# Patient Record
Sex: Male | Born: 1975 | Race: White | Hispanic: No | Marital: Married | Smoking: Never smoker
Health system: Southern US, Community
[De-identification: ages and names within clinical notes are randomized; demographics above are authoritative.]

## PROBLEM LIST (undated history)

## (undated) DIAGNOSIS — D446 Neoplasm of uncertain behavior of carotid body: Secondary | ICD-10-CM

## (undated) DIAGNOSIS — E781 Pure hyperglyceridemia: Secondary | ICD-10-CM

## (undated) DIAGNOSIS — I10 Essential (primary) hypertension: Secondary | ICD-10-CM

## (undated) DIAGNOSIS — J45909 Unspecified asthma, uncomplicated: Secondary | ICD-10-CM

## (undated) HISTORY — DX: Essential (primary) hypertension: I10

## (undated) HISTORY — DX: Unspecified asthma, uncomplicated: J45.909

## (undated) HISTORY — PX: VASECTOMY: SHX75

## (undated) HISTORY — DX: Pure hyperglyceridemia: E78.1

## (undated) HISTORY — DX: Neoplasm of uncertain behavior of carotid body: D44.6

---

## 2001-04-22 ENCOUNTER — Encounter: Payer: Self-pay | Admitting: Emergency Medicine

## 2001-04-22 ENCOUNTER — Emergency Department (HOSPITAL_COMMUNITY): Admission: EM | Admit: 2001-04-22 | Discharge: 2001-04-22 | Payer: Self-pay | Admitting: Emergency Medicine

## 2004-03-15 ENCOUNTER — Ambulatory Visit: Payer: Self-pay | Admitting: Internal Medicine

## 2004-03-22 ENCOUNTER — Ambulatory Visit: Payer: Self-pay | Admitting: Internal Medicine

## 2006-04-23 HISTORY — PX: CAROTID BODY TUMOR EXCISION: SHX5156

## 2007-01-16 ENCOUNTER — Ambulatory Visit: Payer: Self-pay | Admitting: Internal Medicine

## 2007-01-20 LAB — CONVERTED CEMR LAB
ALT: 53 units/L (ref 0–53)
AST: 29 units/L (ref 0–37)
Albumin: 4.5 g/dL (ref 3.5–5.2)
Alkaline Phosphatase: 83 units/L (ref 39–117)
BUN: 7 mg/dL (ref 6–23)
Basophils Absolute: 0 10*3/uL (ref 0.0–0.1)
Basophils Relative: 0.4 % (ref 0.0–1.0)
Bilirubin, Direct: 0.1 mg/dL (ref 0.0–0.3)
CO2: 29 meq/L (ref 19–32)
Calcium: 9.8 mg/dL (ref 8.4–10.5)
Chloride: 103 meq/L (ref 96–112)
Creatinine, Ser: 0.9 mg/dL (ref 0.4–1.5)
Eosinophils Absolute: 0.1 10*3/uL (ref 0.0–0.6)
Glucose, Bld: 77 mg/dL (ref 70–99)
Hemoglobin: 15 g/dL (ref 13.0–17.0)
MCV: 88.5 fL (ref 78.0–100.0)
Neutrophils Relative %: 53.3 % (ref 43.0–77.0)
Sodium: 139 meq/L (ref 135–145)
Total Bilirubin: 0.8 mg/dL (ref 0.3–1.2)

## 2007-01-21 ENCOUNTER — Ambulatory Visit (HOSPITAL_COMMUNITY): Admission: RE | Admit: 2007-01-21 | Discharge: 2007-01-21 | Payer: Self-pay | Admitting: Otolaryngology

## 2007-02-13 ENCOUNTER — Ambulatory Visit: Payer: Self-pay | Admitting: *Deleted

## 2007-02-13 ENCOUNTER — Encounter: Payer: Self-pay | Admitting: Internal Medicine

## 2007-02-18 ENCOUNTER — Encounter: Payer: Self-pay | Admitting: *Deleted

## 2007-03-14 ENCOUNTER — Encounter (INDEPENDENT_AMBULATORY_CARE_PROVIDER_SITE_OTHER): Payer: Self-pay | Admitting: *Deleted

## 2007-03-14 ENCOUNTER — Ambulatory Visit: Payer: Self-pay | Admitting: *Deleted

## 2007-03-15 ENCOUNTER — Inpatient Hospital Stay (HOSPITAL_COMMUNITY): Admission: RE | Admit: 2007-03-15 | Discharge: 2007-03-17 | Payer: Self-pay | Admitting: Interventional Radiology

## 2007-03-17 ENCOUNTER — Ambulatory Visit: Payer: Self-pay | Admitting: *Deleted

## 2007-04-09 ENCOUNTER — Encounter: Payer: Self-pay | Admitting: Internal Medicine

## 2007-04-10 ENCOUNTER — Ambulatory Visit: Payer: Self-pay | Admitting: *Deleted

## 2008-01-01 ENCOUNTER — Ambulatory Visit (HOSPITAL_COMMUNITY): Admission: RE | Admit: 2008-01-01 | Discharge: 2008-01-01 | Payer: Self-pay | Admitting: Otolaryngology

## 2008-07-11 IMAGING — CT CT NECK W/ CM
3 of 4 series · 16 of 33 positions shown, 19 images · IV contrast (APPLIED)
Comparison: None.

CLINICAL DATA: 30-year-old, right neck mass.  
NECK CT WITH CONTRAST:
TECHNIQUE: Multidetector CT imaging of the neck was performed following the standard protocol during administration of intravenous contrast.
Contrast:  100 cc Omnipaque 300

[Series 2: st neck 2.0 b31s · axial · 0.46mm/px · z∈[-317,-109]mm · 8 of 134 slices shown, 10 images]
[im 15/134  soft-tissue]
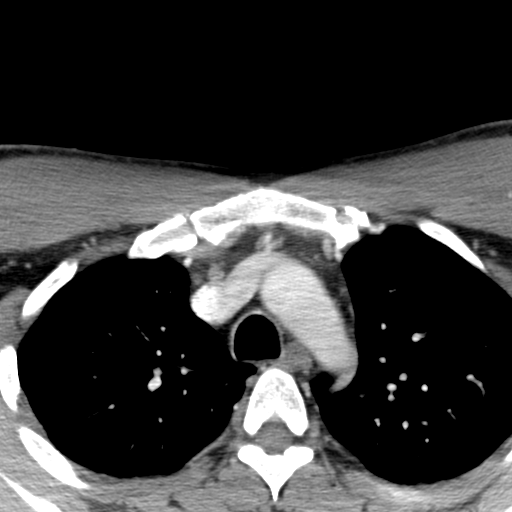
[im 15/134  bone]
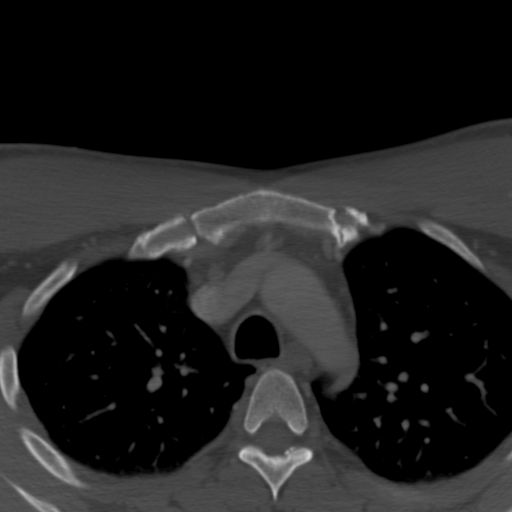
[im 30/134  bone]
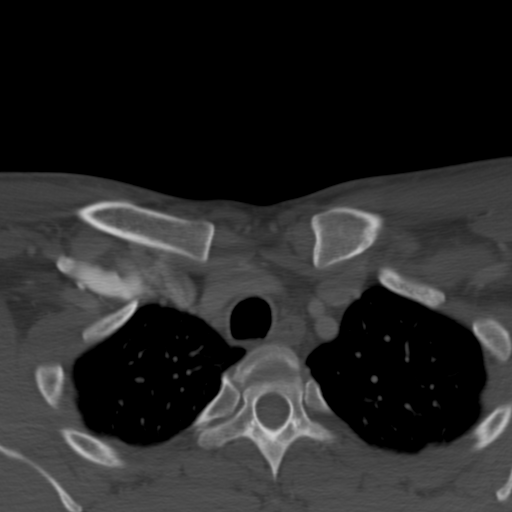
[im 45/134  bone]
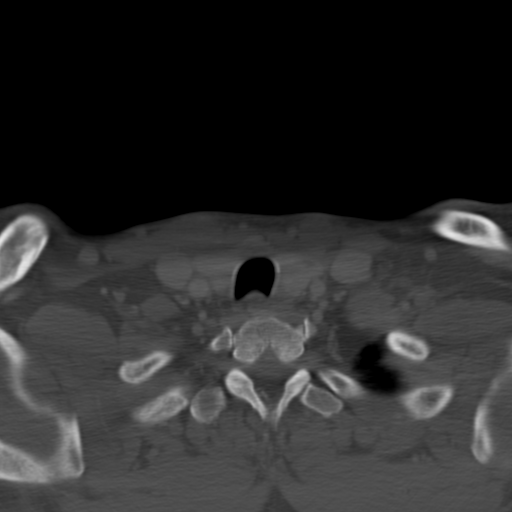
[im 60/134  bone]
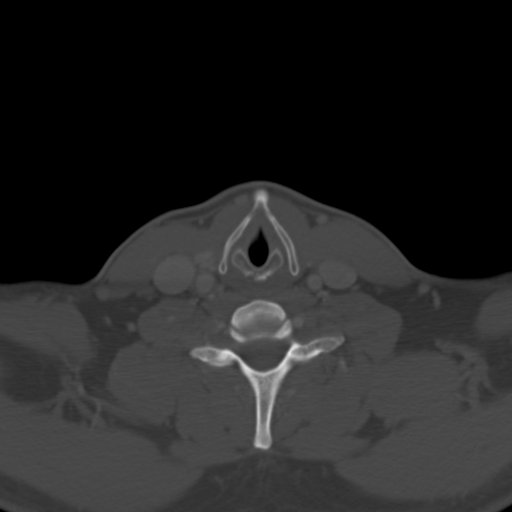
[im 74/134  soft-tissue]
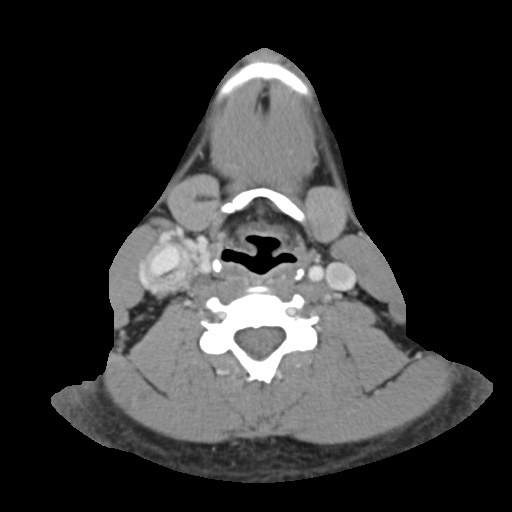
[im 74/134  bone]
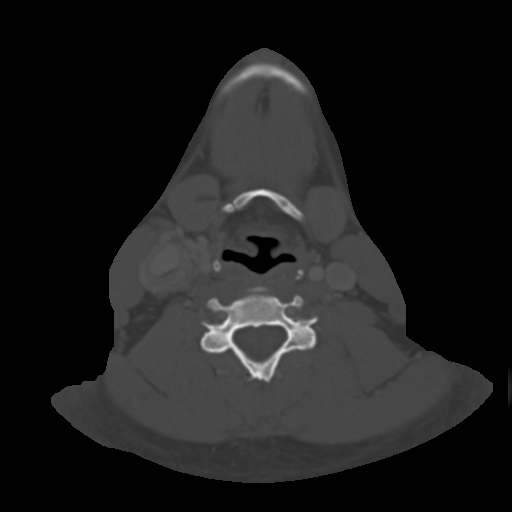
[im 89/134  bone]
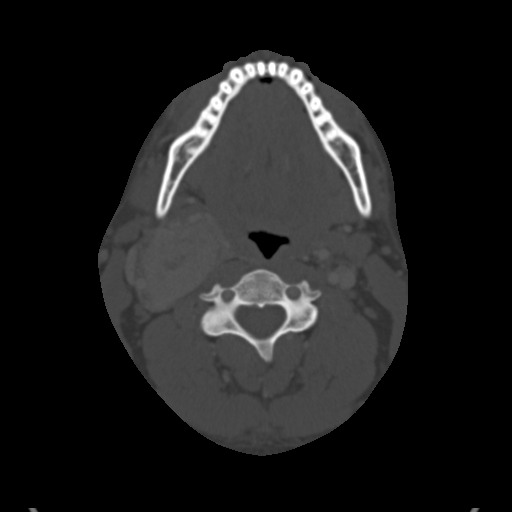
[im 104/134  bone]
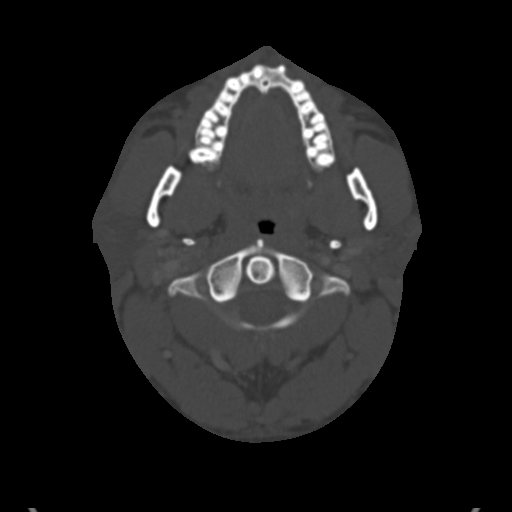
[im 119/134  bone]
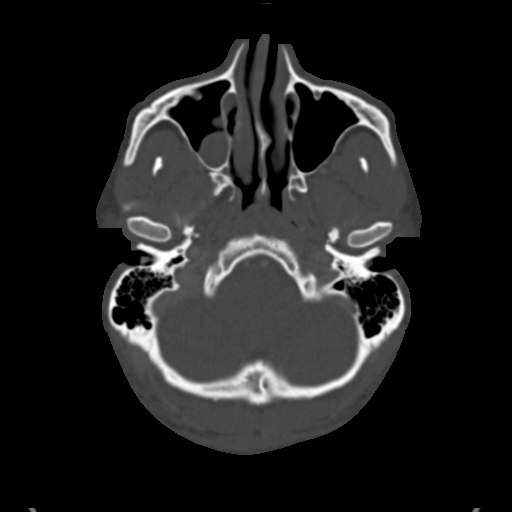

[Series 5: st neck 2.0 spo · coronal · 0.55mm/px · 3 of 105 slices shown]
[im 21/105  bone]
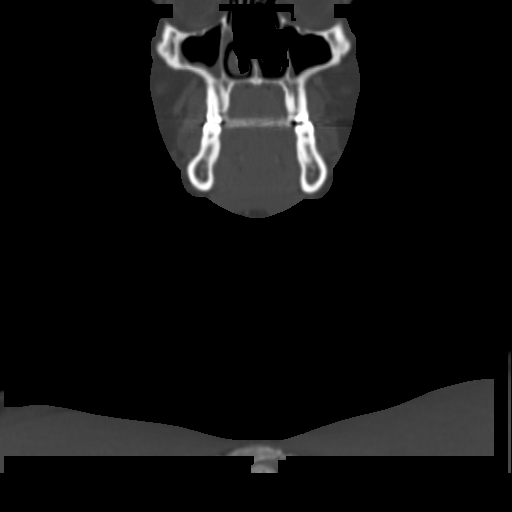
[im 42/105  bone]
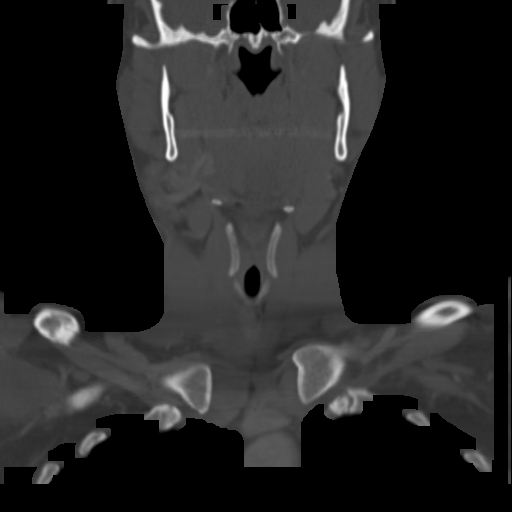
[im 63/105  bone]
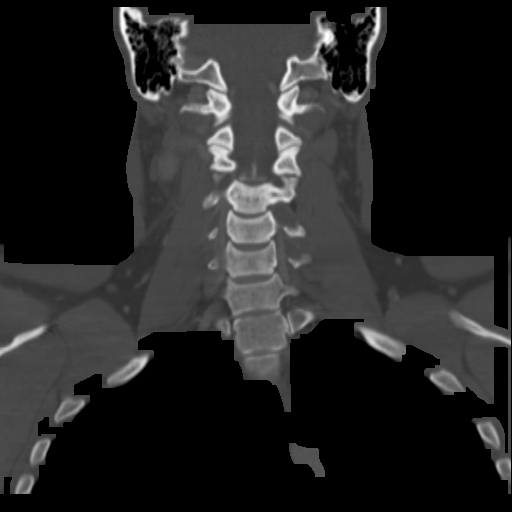

[Series 6: st neck 2.0 spo sag · sagittal · 0.54mm/px · 5 of 117 slices shown, 6 images]
[im 39/117  bone]
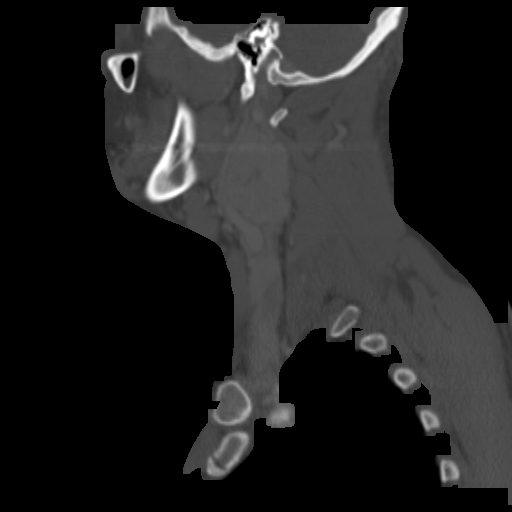
[im 49/117  bone]
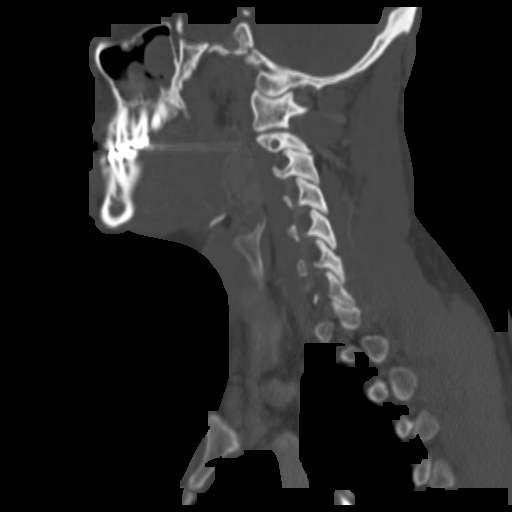
[im 59/117  soft-tissue]
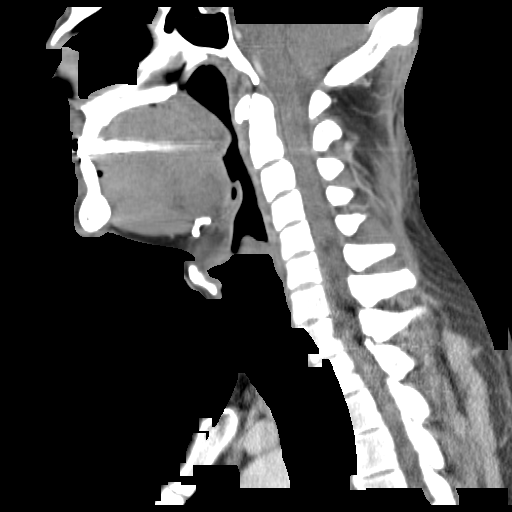
[im 59/117  bone]
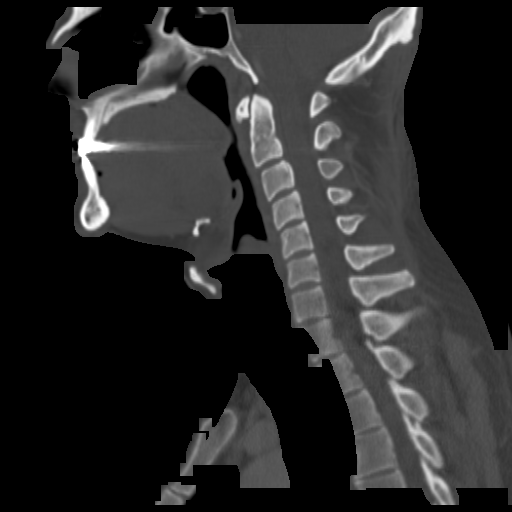
[im 68/117  bone]
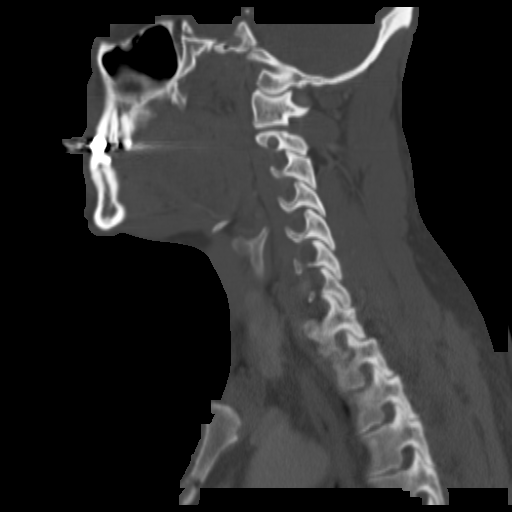
[im 78/117  bone]
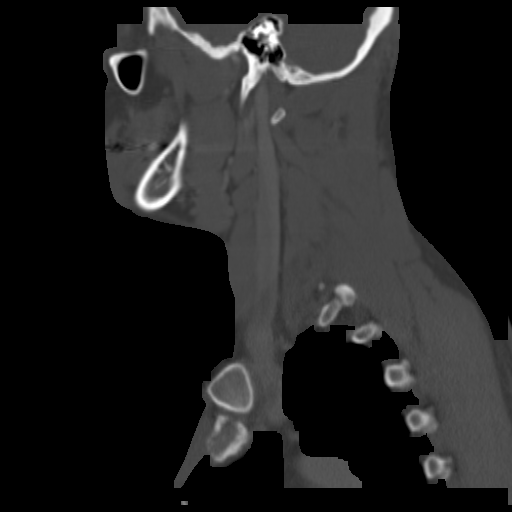

[16 of 33 positions shown; findings below may reference images not displayed]

FINDINGS: There is a large enhancing mass involving the carotid space.  The epicenter is in the bifurcation region of the common carotid artery into the internal and external carotid arteries.  This is most likely a carotid body tumor/paraganglioma given its location and enhancement features.  It measures approximately 5.6 x 4.4 x 3.4 cm.  It is also possible that this could represent a glomus tumor but I think that is less likely.  No adenopathy.  The skull base, floor of the mouth, and tongue base are unremarkable.  There is a mixed retention cyst or polyp noted in the right maxillary sinus.  The vertebral arteries appear normal.  The carotid arteries and jugular veins are intact bilaterally.  The larynx is normal.  The submandibular glands and parotid glands are unremarkable.  
The lung apices are clear.  No superior mediastinal or supraclavicular adenopathy.  Thyroid gland is normal.
IMPRESSION: 1.  Large, 5 x 4 x 3 cm enhancing mass in the carotid space with its epicenter at the bifurcation of the common carotid artery on the right side.  This is most likely a carotid body tumor/paraganglioma.  
2.  No neck adenopathy.  
3.  Normal larynx.

## 2010-09-05 NOTE — Discharge Summary (Signed)
NAMEKHOLE, BRANCH                 ACCOUNT NO.:  [id number]   MEDICAL RECORD NO.:  [id number]          PATIENT TYPE:  INP   LOCATION:  3307                         FACILITY:  [facility]   PHYSICIAN:  Newman Pies, MD            DATE OF BIRTH:  Dec 20, 1975   DATE OF ADMISSION:  03/13/2007  DATE OF DISCHARGE:  03/17/2007                               DISCHARGE SUMMARY   CO-SURGEONS:  1. Balinda Quails, M.D.  2. Newman Pies, M.D.   ADMISSION DIAGNOSIS:  Right carotid body tumor.   DISCHARGE DIAGNOSIS:  Right carotid body tumor.   CONSULTATIONS:  Interventional radiology.   ADMITTING SERVICES:  Vascular surgery, otolaryngology and head and neck  surgery.   PROCEDURE PERFORMED:  1. Embolization of the right carotid body tumor by Dr. Corliss Skains.  2. Right carotid body tumor resection.  3. Light selective neck lymphadenectomy.   HISTORY OF PRESENT ILLNESS:  [name] is a 34 year old white male  with a history of a 5 cm right carotid body tumor.  The tumor was noted  at the bifurcation of the right external and internal carotid artery.  Based on the site of the tumor, the decision was made for the patient to  undergo preop embolization of the tumor prior to surgical excision.  He  was admitted on March 13, 2007 for embolization of the tumor by Dr.  Corliss Skains.  He underwent the procedure without difficulty.  On November  21, of 2008, he was taken to the operating room for the resection of the  surgery.  Please see the operative note dictation for the details of the  surgery.   After the surgery the patient was observed in the intensive care unit  overnight.  He did well without any significant difficulty.  His initial  dysphagia gradually improved on postop day #2 and day #3.  His cranial  nerves, XI and XII, were intact.  His voice was noted to be normal and  strong.  On March 17, 2007, the patient was discharged home in good  condition.  At the time of discharge, he was tolerating  p.o. without  difficulty.  His neck incision was clean, dry and intact.  No drainage.  All skin erythema was noted.   DISCHARGE DIET:  Regular diet.   DISCHARGE ACTIVITY:  Up ad lib.   DISCHARGE MEDICATIONS:  1. Percocet one to two tablet p.o. q.4-6 h p.r.n. pain.  2. Keflex 500 mg p.o. q.i.d. for 5 days.   FOLLOW-UP CARE:  The patient will follow-up with Dr. Suszanne Conners in 1 week and  Dr. Madilyn Fireman in 2 weeks.  He is encouraged to call with any questions or  concerns.      Newman Pies, MD  Electronically Signed     ST/MEDQ  D:  03/17/2007  T:  03/17/2007  Job:  [id number]

## 2010-09-05 NOTE — Op Note (Signed)
[name], Roberto Black NO.:  [id number]   MEDICAL RECORD NO.:  [id number]          PATIENT TYPE:  OIB   LOCATION:  3106                         FACILITY:  [facility]   PHYSICIAN:  Newman Pies, MD            DATE OF BIRTH:  [birth year]   DATE OF PROCEDURE:  03/14/2007  DATE OF DISCHARGE:                               OPERATIVE REPORT   CO-SURGEONS:  Newman Pies, M.D., and Bud Face, M.D.   PREOPERATIVE DIAGNOSIS:  A 5-cm right carotid body tumor.   POSTOPERATIVE DIAGNOSIS:  A 5-cm right carotid body tumor.   PROCEDURE PERFORMED:  1. Excision of the right carotid body tumor with preservation of the      internal carotid artery and resection of the external carotid      artery.  2. Right selective level I, II and III neck dissection.   ANESTHESIA:  General endotracheal tube anesthesia.   COMPLICATIONS:  None.   ESTIMATED BLOOD LOSS:  200 mL.   INDICATIONS FOR PROCEDURE:  [name] is a 35 year old white male with  a 1-year history of right neck mass.  CT scan showed a 5-cm right  carotid body tumor.  The tumor was located at the junction of the right  internal and external carotid artery.  Based on that finding, the  decision was made for the patient to undergo resection of the right  carotid body tumor.  Due to the large size of the tumor, it was deemed  necessary to remove the right submandibular gland and level I, II and  III lymph node to completely expose the tumor.  The patient underwent  embolization of the right carotid body tumor prior to the surgery.  The  risks, benefits, alternatives, and details of the procedures were  discussed with the patient.  Questions were invited and answered.  Informed consent was obtained.   DESCRIPTION OF PROCEDURE:  The patient was taken to the operating room  and placed supine on the operating table.  General endotracheal tube  anesthesia was administered by the anesthesiologist.  Preoperative IV  cephazolin antibiotic was  given.  The patient was then positioned and  prepped and draped in a standard fashion for right carotid body tumor  resection.  One percent lidocaine with 1:100,000 epinephrine were  injected at the planned site of incisions.  A curvilinear incision was  made, extending from the right mastoid down to the right anterior neck.  Platysmal flap was elevated in the usual fashion.  The right external  jugular vein and the right great auricular nerve were sacrificed due to  the need to expose the tumor widely.  Attention was first focused on  removing the right submandibular gland and the level I lymph node.  The  fascia overlying the right submandibular gland was elevated, preserving  the right marginal mandibular branch of the facial nerve.  The right  submandibular gland and the facial level I lymph nodes were subsequently  removed, preserving the lingual nerve and the hypoglossal nerve.   The right level II and III  lymph node was then resected free from the  sternocleidomastoid muscle.  The spinal accessory nerve was identified  and preserved.  The lymph node was resected free from the internal  jugular vein.  At this point, the large glomus tumor was identified at  the junction between the external and internal carotid arteries.  The  tumor was then carefully dissected free from the internal carotid  artery.  The feeding vessels were suture ligated prior to transection.  The tumor was noted to be completely encasing the external carotid  artery.  The external carotid artery was subsequently ligated at the  level of the carotid bifurcation.  It was subsequently transected to  free the tumor from its inferior attachment.  The vagus nerve was  subsequently identified.  The tumor was carefully dissected free from  the vagus nerve.  The tumor was then carefully dissected free from the  surrounding soft tissues, with care taken to suture ligate all feeding  vessels before transection.  The ansa  cervicalis nerve and the superior  laryngeal nerves were noted to be involved in the tumor and was removed.  The entire tumor was then removed, preserving the spinal accessory  nerve, vagus nerve, and hypoglossal nerve.  The internal carotid artery  was intact and was noted to have a good flow signal at the end of the  case.  The surgical site was copiously irrigated.  A #10 JP drain was  placed.  The subplatysmal skin flap was closed in layers using 3-0  Vicryl and 5-0 Monocryl.  The care of the patient was turned over to the  anesthesiologist.  Dr. Madilyn Fireman and I were scrubbed in for the entire case.  The patient was subsequently awakened from anesthesia without  difficulty.  He was extubated and transferred to the recovery room in  good condition.   OPERATIVE FINDINGS:  A large 5-cm carotid body tumor was noted at the  junction of the carotid bifurcation on the right side.  In order to  expose the carotid tumor, a right submandibular gland, level I, II and  III lymph nodes were resected together with a carotid body tumor.  The  spinal accessory nerve, vagus nerve, and hypoglossal nerves were  identified and preserved.  The external jugular vein and the external  carotid artery were transected.   SPECIMENS REMOVED:  Right carotid body tumor, right level I, II and III  lymph nodes with the right submandibular gland.   FOLLOW-UP CARE:  The patient will be returned to the surgical unit.  He  will be placed on the neural monitoring.  The patient will likely be  discharged home in 2-3 days.      Newman Pies, MD  Electronically Signed     ST/MEDQ  D:  03/14/2007  T:  03/15/2007  Job:  [id number]

## 2010-09-05 NOTE — Op Note (Signed)
Roberto Black, CENTRELLA NO.:  [id number]   MEDICAL RECORD NO.:  [id number]          PATIENT TYPE:  OIB   LOCATION:  3106                         FACILITY:  [facility]   PHYSICIAN:  Balinda Quails, M.D.    DATE OF BIRTH:  [birth year]   DATE OF PROCEDURE:  03/14/2007  DATE OF DISCHARGE:                               OPERATIVE REPORT   SURGEON:  P Bud Face, MD,   CO-SURGEON:  Newman Pies, M.D.   ANESTHESIA:  General endotracheal anesthesia.   ANESTHESIOLOGIST:  Guadalupe Maple, M.D.   PREOPERATIVE DIAGNOSIS:  A 5 cm right carotid body tumor.   POSTOPERATIVE DIAGNOSIS:  A 5 cm right carotid body tumor.   OPERATION/PROCEDURE:  Resection of right carotid body tumor.   CLINICAL NOTE:  Sang T. Peeples is a 35 year old male who describes  feeling of a right neck mass for at least a year.  He eventually was  seen by Dr.  Suszanne Conners and had a CT scan verifying a fairly classic-appearing  right carotid body tumor.  This was 5 cm in size.  The patient was  admitted to the hospital yesterday and underwent embolization by Dr.  Corliss Skains in neuroradiology.   He is brought to the operating at this time for planned resection of  right carotid body tumor.  Potential risks the operative procedure were  reviewed with the patient in detail, major being mortality 2-3%.  Major  complications including significant cranial nerve injury were discussed  with the patient.   OPERATIVE PROCEDURE:  The patient brought to the operating room stable  condition.  Placed in supine position.  Arterial line in place.  Right  neck prepped and draped in sterile fashion.   Dr. Suszanne Conners then completed exposure through a transverse neck incision.  Details of this are dictated under separate heading.   Once full exposure was completed, the right common carotid artery was  exposed proximally and encircled with vessel loop.  Distal dissection  then carried up to the bifurcation.  At the carotid bifurcation this  large carotid body tumor was evident.  Extensive vessels were identified  feeding into and arising from the tumor.  The right internal jugular  vein was dissected off of the tumor laterally.  Facial vein ligated with  2-0 silk and divided.  The right internal carotid artery was identified  and the tumor was dissected off of the anterior surface of the anterior  medial surface of right internal carotid artery using a combination of  bipolar cautery and ligation of vessels.   The right external carotid artery was freed and exposed at its origin  and ligated with O silk proximally distally and divided.  This allowed  the tumor to be freed from the bifurcation.  A known block resection of  the tumor along with the right external carotid artery and its branches  was carried out.   The right internal carotid artery was preserved along with the right  vagus nerve.  The hypoglossal nerve and spinal sensory nerve were all  preserved.  The greater auricular nerve was divided  and sacrificed.  The  superior laryngeal nerve also was sacrificed as this coursed through the  tumor.   Details of the neck dissection carried out by Dr. Suszanne Conners along with a  right submandibular gland excision are dictated under separate heading.   The patient tolerated the procedure well.  All gross tumor was removed.  This was an encapsulated tumor.  At completion of the procedure right  internal carotid artery was widely patent with excellent Doppler signal.   The right carotid bifurcation was fixed to the surrounding muscle with  interrupted 6-0 Prolene suture to avoid any torquing and twisting.   Dr. Suszanne Conners then completed closure of the wound.   The patient tolerated procedure well.  No apparent complications.  Transferred to recovery room in stable condition.      Balinda Quails, M.D.  Electronically Signed     PGH/MEDQ  D:  03/14/2007  T:  03/15/2007  Job:  [id number]

## 2010-09-05 NOTE — Consult Note (Signed)
NAMEJOSTEN, WARMUTH                 ACCOUNT NO.:  [id number]   MEDICAL RECORD NO.:  [id number]          PATIENT TYPE:  OUT   LOCATION:  XRAY                         FACILITY:  [facility]   PHYSICIAN:  Sanjeev K. Deveshwar, M.D.DATE OF BIRTH:  Aug 21, 1975   DATE OF CONSULTATION:  02/18/2007  DATE OF DISCHARGE:                                 CONSULTATION   CHIEF COMPLAINT:  Carotid body tumor.   HISTORY OF PRESENT ILLNESS:  This is a very pleasant 35 year old male  referred to Dr. Corliss Skains through the courtesy of Dr. Liliane Bade.  The  patient has a history of a right neck mass that has been present for  approximately 1 year.  A CT scan was performed on January 21, 2007,  that revealed a 5 x 4 x 3-cm mass of the right carotid space consistent  with a carotid body tumor/paraganglioma.  There was no adenopathy noted.  The patient has been seen by Dr. Suszanne Conners, an ear, nose and throat  physician.  He and Dr. Madilyn Fireman have discussed the case and the plan is for  resection of the tumor.  The patient has been referred to Dr. Corliss Skains  for a cerebral angiogram and embolization of the vascular supply of  prior to surgery.  The patient presents today for that consult.   PAST MEDICAL HISTORY:  The patient has been very healthy.  He had  childhood asthma, which resolved on its own in his mid 80s.  He at one  point had hypertriglyceridemia; however, he states that this is  resolved.  He has a history of a clavicle fracture.   SURGICAL HISTORY:  The patient has had no major surgeries.  He is never  had general anesthesia.   ALLERGIES:  No known drug allergies.  He denies allergy to contrast dye,  shrimp, iodine, shellfish or latex.   CURRENT MEDICATIONS:  He is not on any medications.   SOCIAL HISTORY:  The patient is married.  He has two children.  He lives  in [place].  He does not use alcohol or tobacco.  He works as a  Engineer, civil (consulting).   FAMILY HISTORY:  His mother is alive and well at age 62.  His  father is  alive at age 58 and has hypertension and hyperlipidemia.  He has one  brother and one sister, who are both alive and well.   IMPRESSION AND PLAN:  As noted, the patient presents today to discuss an  angiogram and embolization of a carotid tumor.  Dr. Corliss Skains reviewed  the results of the patient's CT scan with the patient.  He pointed out  the area of tumor in the carotid artery.  The embolization procedure was  described along with the risks and benefits.  The patient is anxious to  proceed.  He would like to have everything done at one hospital  admission, which would, hopefully, include the arteriogram and the  embolization as well as the surgery.  Dr. Corliss Skains has discussed this  plan with Dr. Madilyn Fireman.  A tentative plan is to perform the arteriogram and  embolization on  a Monday with the surgery  on Wednesday.  Dr. Dr. Madilyn Fireman and Dr. Suszanne Conners will be performing the surgery  together.  Arrangements will be finalized once the scheduling has been  completed.   Greater than 30 minutes was spent on this consult.  All of the patient's  questions were answered.  Delton See, P.A.    ______________________________  Grandville Silos. Corliss Skains, M.D.    DR/MEDQ  D:  02/18/2007  T:  02/19/2007  Job:  [id number]   cc:   Karie Schwalbe, MD  Newman Pies, MD  P. Liliane Bade, M.D.

## 2010-09-05 NOTE — Assessment & Plan Note (Signed)
OFFICE VISIT   OLIVER, HEITZENRATER  DOB:  1976/03/27                                       04/10/2007  BMWUX#:[id number]   Zaine is status post resection of a right carotid body tumor carried out  by myself and Dr. Suszanne Conners on 03/14/07. This was a well orchestrated  procedure without apparent complications. He has had some mild  swallowing problems. His voice has been good. No significant pain.   Vital signs are stable. BP mildly elevated at 154/96. Right neck  incision healing well.   I am quite pleased with these results on [facility]. He informs me that Dr.  Suszanne Conners will be following up with him in approximately 6 weeks. I will  discharge him from further follow up this time under the assumption that  Dr. Suszanne Conners will continue to follow up with him on a regular basis.   Balinda Quails, M.D.  Electronically Signed   PGH/MEDQ  D:  04/10/2007  T:  04/11/2007  Job:  [id number]   cc:   Karie Schwalbe, MD  Newman Pies, MD

## 2010-09-05 NOTE — Consult Note (Signed)
VASCULAR SURGERY CONSULTATION   Roberto Black, Roberto Black  DOB:  [birth year]                                       02/13/2007  VWUJW#:[id number]   PRIMARY CARE PHYSICIAN:  Karie Schwalbe, M.D.   REASON FOR REFERRAL:  Right neck mass.   HISTORY:  Patient is a 35 year old generally well white male referred  for evaluation of right neck mass.  Patient describes approximately one  year of a feeling of fullness in his right neck.  In July of this year,  he brought this to the attention of his family physician, who referred  him to Dr. Illene Silver for further evaluation.  Dr. Suszanne Conners did feel there was  a fullness in the right neck and a CT scan was performed.  This reveals  a 5 x 4 x 3 cm enhancing right carotid space mass consistent with a  carotid body tumor.  There is no associated neck adenopathy.  Patient  denies any pain associated with this.  There is no history of tobacco  use, no sore throat, or painful swallowing.  No recent fever, chills,  shortness of breath, cough, or sputum production.  No recent history of  nasal congestion or mononucleosis.   PAST MEDICAL HISTORY:  Childhood asthma.   SOCIAL HISTORY:  Patient is married with two children.  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  He does not smoke  or use tobacco products.  Does not drink alcohol on a regular basis.   MEDICATIONS:  None.   ALLERGIES:  None known.   FAMILY HISTORY:  Mother living at age 78, generally well.  Father living  at age 77 with hypertension and hyperlipidemia.  One brother, age 60,  one sister, age 40, generally well.  No family history of carotid body  tumors or endocrine tumors.   REVIEW OF SYSTEMS:  Refer to patient encounter form.  Patient denies  weight loss, anorexia, or fever.  No chest pain or shortness of breath.  Denies cough or sputum production.  No change in bowel habits.  No  urinary tract symptoms.  Denies joint discomfort.  No syncope,  presyncope, or dizziness.  Denies sensory, motor, or visual deficit.  No  depression.  No visual or hearing changes.  Denies bleeding problems.   PHYSICAL EXAMINATION:  Generally well-appearing 35 year old male.  Alert  and oriented.  No acute distress.  HEENT:  Pupils are equal and  reactive.  Extraocular movements are intact.  Mouth and throat are  clear.  Neck:  No palpable adenopathy.  There is a fullness in the right  neck, which is nontender.  Poorly defined mass.  No thyroid enlargement.  Chest:  Clear.  Air entry equal bilaterally.  No rales or rhonchi.  Cardiovascular:  No carotid bruits.  Heart sounds are normal without  murmurs.  Abdomen:  Soft and nontender.  No masses or organomegaly.  Neurologic:  Cranial nerves are intact.  Strength equal bilaterally.  Normal gait.  Intact sensation.   IMPRESSION:  A 5 cm right neck mass consistent with carotid body tumor.   RECOMMENDATIONS:  Arrange preoperative arteriography and embolization  per Dr. Kerby Nora.   PLAN:  Concomitant resection with cosurgeon Dr. Suszanne Conners and myself.   Balinda Quails, M.D.  Electronically Signed  PGH/MEDQ  D:  02/13/2007  Black:  02/15/2007  Job:  [id number]   cc:   Karie Schwalbe, MD  Newman Pies, MD

## 2011-01-30 LAB — BASIC METABOLIC PANEL
BUN: 15
BUN: 7
CO2: 27
Calcium: 9.6
Chloride: 103
GFR calc Af Amer: >60
GFR calc Af Amer: >60
GFR calc non Af Amer: >60
Glucose, Bld: 93
Glucose, Bld: 95
Potassium: 4

## 2011-01-30 LAB — DIFFERENTIAL
Basophils Absolute: 0
Basophils Relative: 0
Eosinophils Relative: 2
Lymphs Abs: 2.2
Monocytes Absolute: 0.8
Monocytes Relative: 10
Neutro Abs: 4.5

## 2011-01-30 LAB — CROSSMATCH

## 2011-01-30 LAB — CBC
HCT: 40.2
Hemoglobin: 13.7
Hemoglobin: 16.3
MCV: 88.5
WBC: 7.7
WBC: 9.4

## 2011-01-30 LAB — PROTIME-INR
INR: 1.1
INR: 1.1
Prothrombin Time: 14.1

## 2011-01-30 LAB — APTT
aPTT: 32
aPTT: 41 — ABNORMAL HIGH
aPTT: 42 — ABNORMAL HIGH

## 2011-01-30 LAB — ABO/RH: ABO/RH(D): O NEG

## 2011-07-30 ENCOUNTER — Ambulatory Visit (INDEPENDENT_AMBULATORY_CARE_PROVIDER_SITE_OTHER): Payer: 59 | Admitting: Family Medicine

## 2011-07-30 ENCOUNTER — Encounter: Payer: Self-pay | Admitting: Family Medicine

## 2011-07-30 VITALS — BP 138/84 | HR 92 | Temp 97.5°F | Ht 72.0 in | Wt 221.0 lb

## 2011-07-30 DIAGNOSIS — E781 Pure hyperglyceridemia: Secondary | ICD-10-CM | POA: Insufficient documentation

## 2011-07-30 DIAGNOSIS — D447 Neoplasm of uncertain behavior of aortic body and other paraganglia: Secondary | ICD-10-CM

## 2011-07-30 DIAGNOSIS — D446 Neoplasm of uncertain behavior of carotid body: Secondary | ICD-10-CM | POA: Insufficient documentation

## 2011-07-30 DIAGNOSIS — I10 Essential (primary) hypertension: Secondary | ICD-10-CM | POA: Insufficient documentation

## 2011-07-30 LAB — COMPREHENSIVE METABOLIC PANEL
ALT: 58 U/L — ABNORMAL HIGH (ref 0–53)
AST: 28 U/L (ref 0–37)
Alkaline Phosphatase: 66 U/L (ref 39–117)
BUN: 13 mg/dL (ref 6–23)
Calcium: 9.5 mg/dL (ref 8.4–10.5)
Glucose, Bld: 85 mg/dL (ref 70–99)
Potassium: 4 mEq/L (ref 3.5–5.1)
Total Bilirubin: 0.5 mg/dL (ref 0.3–1.2)

## 2011-07-30 LAB — LIPID PANEL
HDL: 30.8 mg/dL — ABNORMAL LOW (ref >39.00)
VLDL: 56.6 mg/dL — ABNORMAL HIGH (ref 0.0–40.0)

## 2011-07-30 MED ORDER — LOSARTAN POTASSIUM 50 MG PO TABS: 50.0000 mg | ORAL_TABLET | Freq: Every day | ORAL | Status: DC

## 2011-07-30 NOTE — Progress Notes (Signed)
Hypertension:    Using medication without problems or lightheadedness: yes Chest pain with exertion: no Edema:no Short of breath:no Average home BPs: 120-130's/80s Tolerating meds well.    Elevated Cholesterol: Using medications without problems:yes Muscle aches: no Diet compliance:yes Exercise:some We talked about diet and exercise.  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    Meds, vitals, and allergies reviewed.   PMH and SH reviewed  ROS: See HPI.  Otherwise negative.    GEN: nad, alert and oriented HEENT: mucous membranes moist, postsurgical changes noted on R side but no mass noted.  NECK: supple w/o LA CV: rrr. PULM: ctab, no inc wob ABD: soft, +bs EXT: no edema SKIN: no acute rash

## 2011-07-30 NOTE — Assessment & Plan Note (Signed)
Controlled, continue current meds.  See notes on lab.  Will work on diet/weight/exercise.

## 2011-07-30 NOTE — Patient Instructions (Signed)
Don't change your meds.  Keep working on diet and exercise.  Look at heart.org for diet/exercise tips.   We'll contact you with your lab report. Glad to see you.

## 2011-07-30 NOTE — Assessment & Plan Note (Signed)
Will continue fish oil, will work on diet and exercise. >30 min spent with face to face with patient, >50% counseling and/or coordinating care.  Old records noted.

## 2011-07-30 NOTE — Assessment & Plan Note (Signed)
Prev surgery notes reviewed.

## 2011-08-02 ENCOUNTER — Encounter: Payer: Self-pay | Admitting: *Deleted

## 2011-11-16 ENCOUNTER — Encounter: Payer: Self-pay | Admitting: Family Medicine

## 2011-11-16 ENCOUNTER — Ambulatory Visit (INDEPENDENT_AMBULATORY_CARE_PROVIDER_SITE_OTHER): Payer: 59 | Admitting: Family Medicine

## 2011-11-16 VITALS — BP 110/84 | HR 83 | Temp 97.9°F | Wt 218.0 lb

## 2011-11-16 DIAGNOSIS — R209 Unspecified disturbances of skin sensation: Secondary | ICD-10-CM

## 2011-11-16 DIAGNOSIS — R202 Paresthesia of skin: Secondary | ICD-10-CM | POA: Insufficient documentation

## 2011-11-16 NOTE — Progress Notes (Signed)
Neck pain.  1 week ago was waterskiing and tubing with family.  Was fine that day and the next.  This Monday, woke with muscle tightness in neck, arm and tingling in L 1st-3rd finger.  Better with motrin.  Getting better.  Still with the sensation of numbness in the hand at night.  ROM in neck is better now.  No motrin since yesterday at 3PM.  No sx on R side, none in legs.  No weakness.    Meds, vitals, and allergies reviewed.   ROS: See HPI.  Otherwise, noncontributory.  nad ncat Neck supple and normal ROM, L trap mildly ttp Normal motor and sensation in the BUE No cuff findings/impingement on L side.  Distally nv intact in the LUE w/o weakness rrr ctab

## 2011-11-16 NOTE — Assessment & Plan Note (Signed)
With resolving muscle tightness.  This could have been from water skiing or just from malposition at night while asleep.  Resolving and unremarkable exam, continue with ibuprofen- GI and renal caution- and local heat for trap.  Should resolve.  F/u prn.

## 2011-11-16 NOTE — Patient Instructions (Addendum)
Take ibuprofen as needed with food.  Drink plenty of fluids and use the heating pad on your trapezius.  Take care.  Should resolve.

## 2011-11-30 ENCOUNTER — Encounter: Payer: Self-pay | Admitting: Family Medicine

## 2011-11-30 ENCOUNTER — Ambulatory Visit (INDEPENDENT_AMBULATORY_CARE_PROVIDER_SITE_OTHER): Payer: 59 | Admitting: Family Medicine

## 2011-11-30 VITALS — BP 118/80 | HR 100 | Temp 98.6°F | Wt 215.0 lb

## 2011-11-30 DIAGNOSIS — Z569 Unspecified problems related to employment: Secondary | ICD-10-CM

## 2011-11-30 DIAGNOSIS — Z566 Other physical and mental strain related to work: Secondary | ICD-10-CM

## 2011-11-30 MED ORDER — CITALOPRAM HYDROBROMIDE 20 MG PO TABS: 20.0000 mg | ORAL_TABLET | Freq: Every day | ORAL | Status: DC

## 2011-11-30 NOTE — Progress Notes (Signed)
"  Overwhelmed".  Work is stressful at baseline and changes are taking place with work hours/shifts.  His salary will be altered, lowered.  Staff cuts are going on.  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  He's been more irritable at home.  He had a complaint (he believes filed unjustly, he has witnesses to support his position) filed against him at work.  He's concerned about patient care in the midst of the changes.  He's considering asking to work in a different department.  No Si/Hi.  He does have insomnia, guilt and tearfulness.   No illicits, excessive etoh use  Meds, vitals, and allergies reviewed.   ROS: See HPI.  Otherwise, noncontributory.  nad Affect wnl, speech and judgement wnl.  No tremor

## 2011-11-30 NOTE — Patient Instructions (Addendum)
Try the celexa 20mg  a day and call me with an update by the end of the month, sooner if needed.  Take care.

## 2011-12-02 DIAGNOSIS — Z566 Other physical and mental strain related to work: Secondary | ICD-10-CM | POA: Insufficient documentation

## 2011-12-02 NOTE — Assessment & Plan Note (Signed)
We talked about possible options: EAP  Talking to bosses, esp re: the complaint SSRI/med use Job change  Short term disability is sx are excessive.  I didn't advise him to change jobs.  He doesn't have excessive sx now; he can continue to work. We talked about nonsedating meds, ie SSRI use, in meantime.  celexa would be reasonable to try.  No SI/HI. Okay for outpatient f/u.  Routine caution and timeline of SSRI use discussed.  >25 min spent with face to face with patient, >50% counseling and/or coordinating care.  He'll call back with update.

## 2012-05-27 ENCOUNTER — Other Ambulatory Visit: Payer: Self-pay | Admitting: Family Medicine

## 2012-08-21 ENCOUNTER — Other Ambulatory Visit: Payer: Self-pay | Admitting: Family Medicine

## 2012-08-21 ENCOUNTER — Ambulatory Visit (INDEPENDENT_AMBULATORY_CARE_PROVIDER_SITE_OTHER): Payer: 59 | Admitting: Family Medicine

## 2012-08-21 ENCOUNTER — Encounter: Payer: Self-pay | Admitting: Family Medicine

## 2012-08-21 VITALS — BP 110/80 | HR 83 | Temp 98.0°F

## 2012-08-21 DIAGNOSIS — Z569 Unspecified problems related to employment: Secondary | ICD-10-CM

## 2012-08-21 DIAGNOSIS — Z566 Other physical and mental strain related to work: Secondary | ICD-10-CM

## 2012-08-21 DIAGNOSIS — I10 Essential (primary) hypertension: Secondary | ICD-10-CM

## 2012-08-21 DIAGNOSIS — Z0000 Encounter for general adult medical examination without abnormal findings: Secondary | ICD-10-CM

## 2012-08-21 DIAGNOSIS — L219 Seborrheic dermatitis, unspecified: Secondary | ICD-10-CM

## 2012-08-21 DIAGNOSIS — E781 Pure hyperglyceridemia: Secondary | ICD-10-CM

## 2012-08-21 LAB — LIPID PANEL: HDL: 23.6 mg/dL — ABNORMAL LOW (ref >39.00)

## 2012-08-21 LAB — COMPREHENSIVE METABOLIC PANEL
AST: 22 U/L (ref 0–37)
BUN: 13 mg/dL (ref 6–23)
Chloride: 101 mEq/L (ref 96–112)
Creatinine, Ser: 0.9 mg/dL (ref 0.4–1.5)
Glucose, Bld: 88 mg/dL (ref 70–99)
Potassium: 4.5 mEq/L (ref 3.5–5.1)
Sodium: 134 mEq/L — ABNORMAL LOW (ref 135–145)
Total Protein: 7.8 g/dL (ref 6.0–8.3)

## 2012-08-21 LAB — LDL CHOLESTEROL, DIRECT: Direct LDL: 80.3 mg/dL

## 2012-08-21 MED ORDER — CITALOPRAM HYDROBROMIDE 20 MG PO TABS: 20.0000 mg | ORAL_TABLET | Freq: Every day | ORAL | Status: DC

## 2012-08-21 MED ORDER — LOSARTAN POTASSIUM 50 MG PO TABS: 50.0000 mg | ORAL_TABLET | Freq: Every day | ORAL | Status: DC

## 2012-08-21 MED ORDER — DESONIDE 0.05 % EX LOTN: TOPICAL_LOTION | Freq: Two times a day (BID) | CUTANEOUS | Status: DC

## 2012-08-21 NOTE — Assessment & Plan Note (Signed)
Routine anticipatory guidance given to patient.  See health maintenance. Tetanus 2012 Flu shot yearly Colon cancer and prostate cancer screening not indicated.   Living will d/w pt.  Wife would be designated if incapacitated.   Diet and exercise d/w pt. Walking and work on diet, low carb.   He's working on both.

## 2012-08-21 NOTE — Assessment & Plan Note (Signed)
Much improved, continue current meds for now.  Consider taper later in the year.  Doing well.

## 2012-08-21 NOTE — Progress Notes (Signed)
CPE- See plan.  Routine anticipatory guidance given to patient.  See health maintenance. Tetanus 2012 Flu shot yearly Colon cancer and prostate cancer screening not indicated.   Living will d/w pt.  Wife would be designated if incapacitated.   Diet and exercise d/w pt. Walking and work on diet, low carb.   He's working on both.   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  Controlled with desonide.  Uses rarely.  Needs refill. No ADE.  Topical steroid caution discussed.    Job stress is much better with job change to [employer], part time with [employer] and possibly with SSRI. He wanted to continue med, no ADE.   He notes an improvement and wife does too. He has more patience and more tolerance.  Less irritable, less negative.   PMH and SH reviewed  Meds, vitals, and allergies reviewed.   ROS: See HPI.  Otherwise negative.    GEN: nad, alert and oriented HEENT: mucous membranes moist NECK: supple w/o LA CV: rrr. PULM: ctab, no inc wob ABD: soft, +bs EXT: no edema SKIN: no acute rash except for mild seborrhea noted on the upper eyelids and the pinnas near the [facility]

## 2012-08-21 NOTE — Patient Instructions (Addendum)
Go to the lab on the way out.  We'll contact you with your lab report.  Take care.  Keep exercising.  Notify us with concerns. Glad to see you.

## 2012-08-21 NOTE — Assessment & Plan Note (Signed)
Controlled, continue meds and work on diet/exercise/weight.

## 2012-08-21 NOTE — Assessment & Plan Note (Signed)
See notes on labs and work on diet/exercise/weight.

## 2012-08-21 NOTE — Assessment & Plan Note (Signed)
Continue prn topical tx with routine steroid caution.  He agrees.

## 2012-08-22 ENCOUNTER — Other Ambulatory Visit: Payer: Self-pay | Admitting: Family Medicine

## 2012-08-22 DIAGNOSIS — E781 Pure hyperglyceridemia: Secondary | ICD-10-CM

## 2012-08-22 MED ORDER — OMEGA-3 FATTY ACIDS 1000 MG PO CAPS: 2.0000 g | ORAL_CAPSULE | Freq: Every day | ORAL | Status: DC

## 2012-08-25 ENCOUNTER — Encounter: Payer: Self-pay | Admitting: *Deleted

## 2012-10-15 ENCOUNTER — Other Ambulatory Visit: Payer: 59

## 2012-10-22 ENCOUNTER — Other Ambulatory Visit (INDEPENDENT_AMBULATORY_CARE_PROVIDER_SITE_OTHER): Payer: BC Managed Care – PPO

## 2012-10-22 ENCOUNTER — Ambulatory Visit (INDEPENDENT_AMBULATORY_CARE_PROVIDER_SITE_OTHER): Payer: BC Managed Care – PPO | Admitting: Family Medicine

## 2012-10-22 ENCOUNTER — Encounter: Payer: Self-pay | Admitting: Family Medicine

## 2012-10-22 VITALS — BP 118/78 | HR 76 | Temp 98.8°F | Wt 219.5 lb

## 2012-10-22 DIAGNOSIS — E781 Pure hyperglyceridemia: Secondary | ICD-10-CM

## 2012-10-22 DIAGNOSIS — K625 Hemorrhage of anus and rectum: Secondary | ICD-10-CM

## 2012-10-22 LAB — LIPID PANEL
Cholesterol: 173 mg/dL (ref 0–200)
Triglycerides: 387 mg/dL — ABNORMAL HIGH (ref 0.0–149.0)

## 2012-10-22 NOTE — Patient Instructions (Addendum)
Use the diltiazem cream as needed.   Update me next week.  We can address the TG medicine then or set up GI if needed.  Take care.

## 2012-10-22 NOTE — Progress Notes (Signed)
TG up, discussed.  Doesn't tolerate fish oil. See plan.    Switched to low carb diet recently, then had hard stools, blood in stool followed that.  He used some miralax to get stools soft.  Still with intermittent blood in stool, even with softer stools.  BRBPR.  No black stools.  No FCNAVD.  No abd pain.  Some pain with BM, sharp pain, at the rectum.  No unintended weight loss.  No FH colon cancer. No h/o crohn's, colitis.   Work is going well.  He's happy about that.    Meds, vitals, and allergies reviewed.   ROS: See HPI.  Otherwise, noncontributory.  nad abd soft, not ttp, normal BS, no masses Rectal inspection w/o hemorrhoids. He may have remnant of fissure at 6 and 12 o'clock.  No gross blood.

## 2012-10-22 NOTE — Assessment & Plan Note (Signed)
Work on [employer] for now and we'll likely start fenofibrate or similar when he updates [place].

## 2012-10-22 NOTE — Assessment & Plan Note (Signed)
No BPBPR for last week.  Likely from fissure after hard stools.  rx written for dilt cream 2%, AAA QID prn.  30g, no rf. Use prn.  He agrees.  If returns, notify us for GI eval.  We didn't start fenofibrate today as I didn't want to induce other bowel changes to cloud the picture.

## 2012-10-29 ENCOUNTER — Encounter: Payer: Self-pay | Admitting: Family Medicine

## 2013-02-26 ENCOUNTER — Other Ambulatory Visit: Payer: Self-pay

## 2013-07-10 ENCOUNTER — Other Ambulatory Visit: Payer: Self-pay | Admitting: Family Medicine

## 2013-07-10 NOTE — Telephone Encounter (Signed)
Electronic refill request. Patient has not been seen in greater than 6 months with no upcoming appt scheduled.  According to refill protocol, please advise.

## 2013-07-12 NOTE — Telephone Encounter (Signed)
Sent, please get him scheduled for a CPE.  Thanks.  

## 2013-07-13 NOTE — Telephone Encounter (Signed)
Left detailed message on voicemail.  

## 2013-09-21 ENCOUNTER — Encounter: Payer: Self-pay | Admitting: Family Medicine

## 2013-09-21 ENCOUNTER — Telehealth: Payer: Self-pay | Admitting: Family Medicine

## 2013-09-21 ENCOUNTER — Ambulatory Visit (INDEPENDENT_AMBULATORY_CARE_PROVIDER_SITE_OTHER): Payer: BC Managed Care – PPO | Admitting: Family Medicine

## 2013-09-21 VITALS — BP 118/76 | HR 72 | Temp 97.8°F | Ht 72.0 in | Wt 221.0 lb

## 2013-09-21 DIAGNOSIS — E781 Pure hyperglyceridemia: Secondary | ICD-10-CM

## 2013-09-21 DIAGNOSIS — Z0000 Encounter for general adult medical examination without abnormal findings: Secondary | ICD-10-CM

## 2013-09-21 DIAGNOSIS — K625 Hemorrhage of anus and rectum: Secondary | ICD-10-CM

## 2013-09-21 DIAGNOSIS — Z566 Other physical and mental strain related to work: Secondary | ICD-10-CM

## 2013-09-21 DIAGNOSIS — I10 Essential (primary) hypertension: Secondary | ICD-10-CM

## 2013-09-21 DIAGNOSIS — L219 Seborrheic dermatitis, unspecified: Secondary | ICD-10-CM

## 2013-09-21 LAB — LIPID PANEL
CHOLESTEROL: 142 mg/dL (ref 0–200)
HDL: 26.8 mg/dL — AB (ref >39.00)
LDL Cholesterol: 49 mg/dL (ref 0–99)
TRIGLYCERIDES: 331 mg/dL — AB (ref 0.0–149.0)
Total CHOL/HDL Ratio: 5
VLDL: 66.2 mg/dL — AB (ref 0.0–40.0)

## 2013-09-21 LAB — COMPREHENSIVE METABOLIC PANEL
ALT: 60 U/L — AB (ref 0–53)
AST: 31 U/L (ref 0–37)
Albumin: 4.4 g/dL (ref 3.5–5.2)
Alkaline Phosphatase: 61 U/L (ref 39–117)
BILIRUBIN TOTAL: 0.4 mg/dL (ref 0.2–1.2)
BUN: 11 mg/dL (ref 6–23)
CO2: 28 meq/L (ref 19–32)
Calcium: 9.2 mg/dL (ref 8.4–10.5)
Chloride: 103 mEq/L (ref 96–112)
Creatinine, Ser: 1 mg/dL (ref 0.4–1.5)
GFR: 89.24 mL/min (ref >60.00)
Glucose, Bld: 86 mg/dL (ref 70–99)
Potassium: 4.3 mEq/L (ref 3.5–5.1)
Sodium: 138 mEq/L (ref 135–145)
Total Protein: 7.4 g/dL (ref 6.0–8.3)

## 2013-09-21 MED ORDER — LOSARTAN POTASSIUM 50 MG PO TABS: 50.0000 mg | ORAL_TABLET | Freq: Every day | ORAL | Status: DC

## 2013-09-21 MED ORDER — DESONIDE 0.05 % EX LOTN: TOPICAL_LOTION | Freq: Two times a day (BID) | CUTANEOUS | Status: DC

## 2013-09-21 NOTE — Assessment & Plan Note (Signed)
Controlled with PRN tx.

## 2013-09-21 NOTE — Assessment & Plan Note (Signed)
Routine anticipatory guidance given to patient.  See health maintenance. Tetanus 2012 Flu 2014 PNA and shingles not due.  Not due for colon or prostate cancer screening. Living will d/w pt.  Wife designated if patient were incapacitated.   Diet and exercise.  He's working on both.

## 2013-09-21 NOTE — Telephone Encounter (Signed)
Relevant patient education assigned to patient using Emmi. ° °

## 2013-09-21 NOTE — Assessment & Plan Note (Signed)
Resolved, no recurrence.

## 2013-09-21 NOTE — Assessment & Plan Note (Signed)
Much improved, he'll try to taper the SSRI later in the summer.

## 2013-09-21 NOTE — Patient Instructions (Signed)
Go to the lab on the way out.  We'll contact you with your lab report. Take care.  Glad to see you.  Keep exercising.

## 2013-09-21 NOTE — Assessment & Plan Note (Signed)
May need a fibrate.  See notes on labs.  D/w pt.  doing well with diet and exercise.

## 2013-09-21 NOTE — Assessment & Plan Note (Signed)
Controlled, continue diet and exercise along with ARB. Doing well.

## 2013-09-21 NOTE — Progress Notes (Signed)
Pre visit review using our clinic review tool, if applicable. No additional management support is needed unless otherwise documented below in the visit note.  CPE- See plan.  Routine anticipatory guidance given to patient.  See health maintenance. Tetanus 2012 Flu 2014 PNA and shingles not due.  Not due for colon or prostate cancer screening. Living will d/w pt.  Wife designated if patient were incapacitated.   Diet and exercise.  He's working on both.    Hypertension:    Using medication without problems or lightheadedness: yes Chest pain with exertion:no Edema:no Short of breath:no  H/o HLD and due for labs.  We had talked about possible fibrate tx.  See notes on labs.    Mood is good. Cutting back to 1/2 tab of celexa.  He is weaning off med.  Has been on 1/2 for about 1 month.  He'll try to get off in about 2-3 months.  He'll notify me if he isn't going well.    PMH and SH reviewed  Meds, vitals, and allergies reviewed.   ROS: See HPI.  Otherwise negative.    GEN: nad, alert and oriented HEENT: mucous membranes moist NECK: supple w/o LA CV: rrr. PULM: ctab, no inc wob ABD: soft, +bs EXT: no edema SKIN: no acute rash

## 2013-09-22 ENCOUNTER — Other Ambulatory Visit: Payer: Self-pay | Admitting: Family Medicine

## 2013-09-22 DIAGNOSIS — E781 Pure hyperglyceridemia: Secondary | ICD-10-CM

## 2013-09-22 MED ORDER — FENOFIBRATE 145 MG PO TABS: 145.0000 mg | ORAL_TABLET | Freq: Every day | ORAL | Status: DC

## 2013-12-14 ENCOUNTER — Other Ambulatory Visit: Payer: Self-pay | Admitting: Family Medicine

## 2014-04-28 ENCOUNTER — Other Ambulatory Visit: Payer: Self-pay | Admitting: Family Medicine

## 2014-04-28 NOTE — Telephone Encounter (Signed)
Sent. Thanks.   

## 2014-04-28 NOTE — Telephone Encounter (Signed)
Received refill request electronically from pharmacy. Last refill and office visit 09/21/13. Is it okay to refill?

## 2014-09-23 ENCOUNTER — Encounter: Payer: Self-pay | Admitting: Family Medicine

## 2014-09-23 ENCOUNTER — Ambulatory Visit (INDEPENDENT_AMBULATORY_CARE_PROVIDER_SITE_OTHER): Payer: BC Managed Care – PPO | Admitting: Family Medicine

## 2014-09-23 VITALS — BP 122/76 | HR 90 | Temp 98.4°F | Ht 72.0 in | Wt 222.8 lb

## 2014-09-23 DIAGNOSIS — E781 Pure hyperglyceridemia: Secondary | ICD-10-CM

## 2014-09-23 DIAGNOSIS — I10 Essential (primary) hypertension: Secondary | ICD-10-CM

## 2014-09-23 DIAGNOSIS — Z7189 Other specified counseling: Secondary | ICD-10-CM

## 2014-09-23 DIAGNOSIS — Z0000 Encounter for general adult medical examination without abnormal findings: Secondary | ICD-10-CM | POA: Diagnosis not present

## 2014-09-23 MED ORDER — FENOFIBRATE 145 MG PO TABS: 145.0000 mg | ORAL_TABLET | Freq: Every day | ORAL | Status: DC

## 2014-09-23 MED ORDER — LOSARTAN POTASSIUM 50 MG PO TABS: 50.0000 mg | ORAL_TABLET | Freq: Every day | ORAL | Status: DC

## 2014-09-23 MED ORDER — DESONIDE 0.05 % EX LOTN: TOPICAL_LOTION | Freq: Two times a day (BID) | CUTANEOUS | Status: DC

## 2014-09-23 NOTE — Patient Instructions (Addendum)
Fasting labs when convenient.   Don't change your meds for now.  I would get a flu shot each fall.   Take care. Glad to see you.

## 2014-09-23 NOTE — Progress Notes (Signed)
Pre visit review using our clinic review tool, if applicable. No additional management support is needed unless otherwise documented below in the visit note.  CPE- See plan.  Routine anticipatory guidance given to patient.  See health maintenance. Routine anticipatory guidance given to patient. See health maintenance. Tetanus 2012 Flu at work PNA and shingles not due.  Not due for colon or prostate cancer screening. Living will d/w pt. Wife designated if patient were incapacitated.  Diet and exercise. He's working on both.  Elevated Cholesterol: Using medications without problems: yes Muscle aches: no Diet compliance: yes Exercise:yes  Hypertension:    Using medication without problems or lightheadedness: yes Chest pain with exertion:no Edema:no Short of breath:no  Using steroid prn with relief.  Rare use.  No ADE.  Good effect.   Off SSRI and doing well. Mood is good.   PMH and SH reviewed  Meds, vitals, and allergies reviewed.   ROS: See HPI.  Otherwise negative.    GEN: nad, alert and oriented HEENT: mucous membranes moist NECK: supple w/o LA CV: rrr. PULM: ctab, no inc wob ABD: soft, +bs EXT: no edema SKIN: no acute rash

## 2014-09-24 DIAGNOSIS — Z7189 Other specified counseling: Secondary | ICD-10-CM | POA: Insufficient documentation

## 2014-09-24 NOTE — Assessment & Plan Note (Signed)
Return for labs.  Continue work on diet and exercise.  He agrees. No change in meds.

## 2014-09-24 NOTE — Assessment & Plan Note (Signed)
Controlled, return for labs.  He agrees.  No change in meds.

## 2014-09-24 NOTE — Assessment & Plan Note (Signed)
Routine anticipatory guidance given to patient. See health maintenance.  Tetanus 2012  Flu at work  PNA and shingles not due.  Not due for colon or prostate cancer screening.  Living will d/w pt. Wife designated if patient were incapacitated.  Diet and exercise. He's working on both.

## 2014-09-30 ENCOUNTER — Other Ambulatory Visit (INDEPENDENT_AMBULATORY_CARE_PROVIDER_SITE_OTHER): Payer: BC Managed Care – PPO

## 2014-09-30 ENCOUNTER — Encounter: Payer: Self-pay | Admitting: *Deleted

## 2014-09-30 DIAGNOSIS — I10 Essential (primary) hypertension: Secondary | ICD-10-CM | POA: Diagnosis not present

## 2014-09-30 LAB — COMPREHENSIVE METABOLIC PANEL
ALT: 54 U/L — AB (ref 0–53)
AST: 23 U/L (ref 0–37)
Albumin: 4.5 g/dL (ref 3.5–5.2)
Alkaline Phosphatase: 51 U/L (ref 39–117)
BUN: 17 mg/dL (ref 6–23)
CO2: 27 meq/L (ref 19–32)
Calcium: 9.6 mg/dL (ref 8.4–10.5)
Chloride: 105 mEq/L (ref 96–112)
Creatinine, Ser: 1.03 mg/dL (ref 0.40–1.50)
GFR: 84.81 mL/min (ref >60.00)
Glucose, Bld: 91 mg/dL (ref 70–99)
Potassium: 4.7 mEq/L (ref 3.5–5.1)
SODIUM: 137 meq/L (ref 135–145)
TOTAL PROTEIN: 7.2 g/dL (ref 6.0–8.3)
Total Bilirubin: 0.4 mg/dL (ref 0.2–1.2)

## 2014-09-30 LAB — LIPID PANEL
CHOL/HDL RATIO: 5
CHOLESTEROL: 130 mg/dL (ref 0–200)
HDL: 26.5 mg/dL — ABNORMAL LOW (ref >39.00)
LDL CALC: 73 mg/dL (ref 0–99)
NonHDL: 103.5
Triglycerides: 151 mg/dL — ABNORMAL HIGH (ref 0.0–149.0)
VLDL: 30.2 mg/dL (ref 0.0–40.0)

## 2015-10-04 ENCOUNTER — Other Ambulatory Visit: Payer: Self-pay | Admitting: Family Medicine

## 2015-10-07 ENCOUNTER — Encounter: Payer: Self-pay | Admitting: Family Medicine

## 2015-10-07 ENCOUNTER — Ambulatory Visit (INDEPENDENT_AMBULATORY_CARE_PROVIDER_SITE_OTHER): Payer: BC Managed Care – PPO | Admitting: Family Medicine

## 2015-10-07 VITALS — BP 102/72 | HR 75 | Temp 97.6°F | Ht 72.0 in | Wt 208.8 lb

## 2015-10-07 DIAGNOSIS — D446 Neoplasm of uncertain behavior of carotid body: Secondary | ICD-10-CM | POA: Diagnosis not present

## 2015-10-07 DIAGNOSIS — I10 Essential (primary) hypertension: Secondary | ICD-10-CM | POA: Diagnosis not present

## 2015-10-07 DIAGNOSIS — Z0000 Encounter for general adult medical examination without abnormal findings: Secondary | ICD-10-CM

## 2015-10-07 DIAGNOSIS — E781 Pure hyperglyceridemia: Secondary | ICD-10-CM | POA: Diagnosis not present

## 2015-10-07 LAB — COMPREHENSIVE METABOLIC PANEL
ALBUMIN: 4.7 g/dL (ref 3.5–5.2)
ALK PHOS: 56 U/L (ref 39–117)
ALT: 30 U/L (ref 0–53)
AST: 17 U/L (ref 0–37)
BILIRUBIN TOTAL: 0.5 mg/dL (ref 0.2–1.2)
BUN: 17 mg/dL (ref 6–23)
CO2: 29 mEq/L (ref 19–32)
Calcium: 9.6 mg/dL (ref 8.4–10.5)
Chloride: 104 mEq/L (ref 96–112)
Creatinine, Ser: 0.93 mg/dL (ref 0.40–1.50)
GFR: 94.94 mL/min (ref >60.00)
Glucose, Bld: 87 mg/dL (ref 70–99)
Potassium: 4.3 mEq/L (ref 3.5–5.1)
Sodium: 138 mEq/L (ref 135–145)
TOTAL PROTEIN: 7.7 g/dL (ref 6.0–8.3)

## 2015-10-07 LAB — LIPID PANEL
CHOLESTEROL: 128 mg/dL (ref 0–200)
HDL: 32.4 mg/dL — ABNORMAL LOW (ref >39.00)
LDL Cholesterol: 72 mg/dL (ref 0–99)
NonHDL: 95.46
Total CHOL/HDL Ratio: 4
Triglycerides: 119 mg/dL (ref 0.0–149.0)
VLDL: 23.8 mg/dL (ref 0.0–40.0)

## 2015-10-07 MED ORDER — LOSARTAN POTASSIUM 50 MG PO TABS: ORAL_TABLET | ORAL | Status: DC

## 2015-10-07 MED ORDER — FENOFIBRATE 145 MG PO TABS: 145.0000 mg | ORAL_TABLET | Freq: Every day | ORAL | Status: DC

## 2015-10-07 NOTE — Assessment & Plan Note (Signed)
Tetanus 2012 Flu at work PNA and shingles not due.  Not due for colon or prostate cancer screening. Living will d/w pt. Wife designated if patient were incapacitated.  Diet and exercise. He's working on both.  Intentional weight loss noted.   [facility].  HCV and HIV testing would have been done at that point.

## 2015-10-07 NOTE — Assessment & Plan Note (Signed)
He'll check BP a few times.  If consistently low (<120/<80), the I would cut the losartan in half.  He agrees.  See notes on labs.

## 2015-10-07 NOTE — Addendum Note (Signed)
Addended by: Marchia Bond on: 10/07/2015 09:57 AM   Modules accepted: Miquel Dunn

## 2015-10-07 NOTE — Assessment & Plan Note (Signed)
No sx, he'll call about routine f/u.

## 2015-10-07 NOTE — Patient Instructions (Signed)
Go to the lab on the way out.  We'll contact you with your lab report. Check your BP a few times.  If consistently low (<120/<80), the I would cut the losartan in half.   Update me as needed.  Take care.  Glad to see you.

## 2015-10-07 NOTE — Progress Notes (Signed)
Pre visit review using our clinic review tool, if applicable. No additional management support is needed unless otherwise documented below in the visit note.  CPE- See plan.  Routine anticipatory guidance given to patient.  See health maintenance. Tetanus 2012 Flu at work PNA and shingles not due.  Not due for colon or prostate cancer screening. Living will d/w pt. Wife designated if patient were incapacitated.  Diet and exercise. He's working on both.  Intentional weight loss noted.   [facility].  HCV and HIV testing would have been done at that point.   Doing well with work.   Hypertension:    Using medication without problems or lightheadedness: yes Chest pain with exertion:no Edema:no Short of breath:no Lower BP noted today.   Elevated Cholesterol: Using medications without problems:yes Muscle aches: no Diet compliance: yes Exercise:yes Due for labs.    PMH and SH reviewed  Meds, vitals, and allergies reviewed.   ROS: Per HPI.  Unless specifically indicated otherwise in HPI, the patient denies:  General: fever. Eyes: acute vision changes ENT: sore throat Cardiovascular: chest pain Respiratory: SOB GI: vomiting GU: dysuria Musculoskeletal: acute back pain Derm: acute rash Neuro: acute motor dysfunction Psych: worsening mood Endocrine: polydipsia Heme: bleeding Allergy: hayfever  GEN: nad, alert and oriented HEENT: mucous membranes moist NECK: supple w/o LA CV: rrr. PULM: ctab, no inc wob ABD: soft, +bs EXT: no edema SKIN: no acute rash, mult benign appear nevi noted on the trunk (he hadn't noted recent changes on any of the lesions)

## 2015-10-07 NOTE — Assessment & Plan Note (Signed)
Weight loss noted, no med change.  See notes on labs.

## 2015-10-30 ENCOUNTER — Other Ambulatory Visit: Payer: Self-pay | Admitting: Family Medicine

## 2016-10-08 ENCOUNTER — Encounter: Payer: Self-pay | Admitting: Family Medicine

## 2016-10-08 ENCOUNTER — Ambulatory Visit (INDEPENDENT_AMBULATORY_CARE_PROVIDER_SITE_OTHER): Payer: BC Managed Care – PPO | Admitting: Family Medicine

## 2016-10-08 VITALS — BP 122/70 | HR 78 | Temp 98.4°F | Ht 72.0 in | Wt 215.5 lb

## 2016-10-08 DIAGNOSIS — Z0000 Encounter for general adult medical examination without abnormal findings: Secondary | ICD-10-CM | POA: Diagnosis not present

## 2016-10-08 DIAGNOSIS — I10 Essential (primary) hypertension: Secondary | ICD-10-CM | POA: Diagnosis not present

## 2016-10-08 DIAGNOSIS — D446 Neoplasm of uncertain behavior of carotid body: Secondary | ICD-10-CM

## 2016-10-08 DIAGNOSIS — E781 Pure hyperglyceridemia: Secondary | ICD-10-CM

## 2016-10-08 LAB — COMPREHENSIVE METABOLIC PANEL
ALK PHOS: 48 U/L (ref 39–117)
ALT: 46 U/L (ref 0–53)
AST: 23 U/L (ref 0–37)
Albumin: 4.8 g/dL (ref 3.5–5.2)
BUN: 14 mg/dL (ref 6–23)
CHLORIDE: 103 meq/L (ref 96–112)
CO2: 28 mEq/L (ref 19–32)
Calcium: 9.9 mg/dL (ref 8.4–10.5)
Creatinine, Ser: 0.9 mg/dL (ref 0.40–1.50)
GFR: 98.12 mL/min (ref >60.00)
Glucose, Bld: 88 mg/dL (ref 70–99)
Potassium: 4.4 mEq/L (ref 3.5–5.1)
SODIUM: 137 meq/L (ref 135–145)
TOTAL PROTEIN: 7.5 g/dL (ref 6.0–8.3)
Total Bilirubin: 0.4 mg/dL (ref 0.2–1.2)

## 2016-10-08 LAB — LIPID PANEL
CHOL/HDL RATIO: 4
Cholesterol: 142 mg/dL (ref 0–200)
HDL: 32.6 mg/dL — AB (ref >39.00)
LDL Cholesterol: 81 mg/dL (ref 0–99)
NONHDL: 109.7
Triglycerides: 144 mg/dL (ref 0.0–149.0)
VLDL: 28.8 mg/dL (ref 0.0–40.0)

## 2016-10-08 MED ORDER — LOSARTAN POTASSIUM 50 MG PO TABS: ORAL_TABLET | ORAL | 3 refills | Status: DC

## 2016-10-08 MED ORDER — DESONIDE 0.05 % EX LOTN: TOPICAL_LOTION | Freq: Two times a day (BID) | CUTANEOUS | 3 refills | Status: DC

## 2016-10-08 MED ORDER — FENOFIBRATE 145 MG PO TABS: 145.0000 mg | ORAL_TABLET | Freq: Every day | ORAL | 3 refills | Status: DC

## 2016-10-08 NOTE — Progress Notes (Signed)
CPE- See plan.  Routine anticipatory guidance given to patient.  See health maintenance.  The possibility exists that previously documented standard health maintenance information may have been brought forward from a previous encounter into this note.  If needed, that same information has been updated to reflect the current situation based on today's encounter.    Tetanus 2012 Flu at work PNA and shingles not due.  Not due for colon or prostate cancer screening. Living will d/w pt. Wife designated if patient were incapacitated. Diet and exercise. He's working on both.  [facility]  HCV and HIV testing would have been done at that point.    Hypertension:    Using medication without problems or lightheadedness: yes Chest pain with exertion:no Edema:no Short of breath:no  Elevated Cholesterol: Using medications without problems:yes Muscle aches: no Diet compliance: encouraged.  Exercise:encouraged.   Seborrhea controlled with prn use of desowen, w/o ADE.  Using as little of med as possible, d/w pt.   PMH and SH reviewed  Meds, vitals, and allergies reviewed.   ROS: Per HPI.  Unless specifically indicated otherwise in HPI, the patient denies:  General: fever. Eyes: acute vision changes ENT: sore throat Cardiovascular: chest pain Respiratory: SOB GI: vomiting GU: dysuria Musculoskeletal: acute back pain Derm: acute rash Neuro: acute motor dysfunction Psych: worsening mood Endocrine: polydipsia Heme: bleeding Allergy: hayfever  GEN: nad, alert and oriented HEENT: mucous membranes moist NECK: supple w/o LA CV: rrr. PULM: ctab, no inc wob ABD: soft, +bs EXT: no edema SKIN: no acute rash

## 2016-10-08 NOTE — Patient Instructions (Addendum)
Go to the lab on the way out.  We'll contact you with your lab report. Take care.  Glad to see you.  Update me as needed.   Keep working on diet and exercise.

## 2016-10-09 NOTE — Assessment & Plan Note (Signed)
Tetanus 2012 Flu at work PNA and shingles not due.  Not due for colon or prostate cancer screening. Living will d/w pt. Wife designated if patient were incapacitated. Diet and exercise. He's working on both.  [facility] HCV and HIV testing would have been done at that point.   

## 2016-10-09 NOTE — Assessment & Plan Note (Signed)
Now 10 years out from procedure. No mass felt in neck by patient or by me. Normal exam now. Update me as needed.  He should not need specific follow up otherwise at this point, other than routine palpation of neck with yearly exam.

## 2016-10-09 NOTE — Assessment & Plan Note (Signed)
No change in meds. See notes on labs. Continue work on diet and exercise. He agrees.

## 2016-11-05 ENCOUNTER — Other Ambulatory Visit: Payer: Self-pay | Admitting: Family Medicine

## 2016-11-12 ENCOUNTER — Other Ambulatory Visit: Payer: Self-pay | Admitting: *Deleted

## 2017-01-09 ENCOUNTER — Other Ambulatory Visit: Payer: Self-pay | Admitting: Family Medicine

## 2017-01-09 DIAGNOSIS — E781 Pure hyperglyceridemia: Secondary | ICD-10-CM

## 2017-10-06 ENCOUNTER — Other Ambulatory Visit: Payer: Self-pay | Admitting: Family Medicine

## 2017-10-06 DIAGNOSIS — I10 Essential (primary) hypertension: Secondary | ICD-10-CM

## 2017-10-11 ENCOUNTER — Other Ambulatory Visit: Payer: BC Managed Care – PPO

## 2017-10-17 ENCOUNTER — Other Ambulatory Visit (INDEPENDENT_AMBULATORY_CARE_PROVIDER_SITE_OTHER): Payer: BC Managed Care – PPO

## 2017-10-17 DIAGNOSIS — I10 Essential (primary) hypertension: Secondary | ICD-10-CM

## 2017-10-17 LAB — COMPREHENSIVE METABOLIC PANEL
ALBUMIN: 4.6 g/dL (ref 3.5–5.2)
ALK PHOS: 40 U/L (ref 39–117)
ALT: 41 U/L (ref 0–53)
AST: 21 U/L (ref 0–37)
BUN: 18 mg/dL (ref 6–23)
CO2: 28 mEq/L (ref 19–32)
Calcium: 9.5 mg/dL (ref 8.4–10.5)
Chloride: 105 mEq/L (ref 96–112)
Creatinine, Ser: 0.98 mg/dL (ref 0.40–1.50)
GFR: 88.51 mL/min (ref >60.00)
GLUCOSE: 97 mg/dL (ref 70–99)
Potassium: 4.4 mEq/L (ref 3.5–5.1)
Sodium: 139 mEq/L (ref 135–145)
Total Bilirubin: 0.4 mg/dL (ref 0.2–1.2)
Total Protein: 7.3 g/dL (ref 6.0–8.3)

## 2017-10-17 LAB — LIPID PANEL
CHOL/HDL RATIO: 5
Cholesterol: 142 mg/dL (ref 0–200)
HDL: 26.7 mg/dL — ABNORMAL LOW (ref >39.00)
LDL CALC: 75 mg/dL (ref 0–99)
NonHDL: 115.03
TRIGLYCERIDES: 198 mg/dL — AB (ref 0.0–149.0)
VLDL: 39.6 mg/dL (ref 0.0–40.0)

## 2017-10-18 ENCOUNTER — Encounter: Payer: Self-pay | Admitting: Family Medicine

## 2017-10-18 ENCOUNTER — Ambulatory Visit (INDEPENDENT_AMBULATORY_CARE_PROVIDER_SITE_OTHER): Payer: BC Managed Care – PPO | Admitting: Family Medicine

## 2017-10-18 VITALS — BP 124/84 | HR 83 | Temp 98.6°F | Ht 72.0 in | Wt 217.0 lb

## 2017-10-18 DIAGNOSIS — I10 Essential (primary) hypertension: Secondary | ICD-10-CM

## 2017-10-18 DIAGNOSIS — E781 Pure hyperglyceridemia: Secondary | ICD-10-CM

## 2017-10-18 DIAGNOSIS — Z0000 Encounter for general adult medical examination without abnormal findings: Secondary | ICD-10-CM | POA: Diagnosis not present

## 2017-10-18 DIAGNOSIS — D446 Neoplasm of uncertain behavior of carotid body: Secondary | ICD-10-CM

## 2017-10-18 DIAGNOSIS — Z7189 Other specified counseling: Secondary | ICD-10-CM

## 2017-10-18 MED ORDER — DESONIDE 0.05 % EX LOTN
TOPICAL_LOTION | Freq: Two times a day (BID) | CUTANEOUS | 3 refills | Status: DC
Start: 2017-10-18 — End: 2019-04-06

## 2017-10-18 MED ORDER — LOSARTAN POTASSIUM 50 MG PO TABS: ORAL_TABLET | ORAL | 3 refills | Status: DC

## 2017-10-18 MED ORDER — FENOFIBRATE 145 MG PO TABS: 145.0000 mg | ORAL_TABLET | Freq: Every day | ORAL | 3 refills | Status: DC

## 2017-10-18 NOTE — Progress Notes (Signed)
CPE- See plan.  Routine anticipatory guidance given to patient.  See health maintenance.  The possibility exists that previously documented standard health maintenance information may have been brought forward from a previous encounter into this note.  If needed, that same information has been updated to reflect the current situation based on today's encounter.    Tetanus 2012 Flu at work PNA and shingles not due.  Not due for colon or prostate cancer screening. Living will d/w pt. Wife designated if patient were incapacitated. Diet and exercise. He's working on both.  [facility] HCV and HIV testing would have been done at that point.    He has been helping his children learn to [place].  Hypertension:    Using medication without problems or lightheadedness: yes Chest pain with exertion:no Edema:no Short of breath:no  Elevated Cholesterol: Using medications without problems: yes Muscle aches: no  Diet compliance:yes Exercise:yes Labs d/w pt.   He snores more now but no known apnea.  D/w pt about options.  He isn't walking with headaches.  He doesn't have fatigue other than related to schedule changes.  Not falling asleep o/w during the day.  He likely wouldn't need intervention now other than continued work on diet and exercise.  See avs.    PMH and SH reviewed  Meds, vitals, and allergies reviewed.   ROS: Per HPI.  Unless specifically indicated otherwise in HPI, the patient denies:  General: fever. Eyes: acute vision changes ENT: sore throat Cardiovascular: chest pain Respiratory: SOB GI: vomiting GU: dysuria Musculoskeletal: acute back pain Derm: acute rash Neuro: acute motor dysfunction Psych: worsening mood Endocrine: polydipsia Heme: bleeding Allergy: hayfever  GEN: nad, alert and oriented HEENT: mucous membranes moist NECK: supple w/o LA CV: rrr. PULM: ctab, no inc wob ABD: soft, +bs EXT: no edema SKIN: no acute rash

## 2017-10-18 NOTE — Patient Instructions (Addendum)
If you have any apnea (ask your wife) then let me know and we'll send you to pulmonary or Dr. Benjamine Mola.  Ask Dr. Benjamine Mola in general about follow up.    Update me as needed.  Keep working on diet and exercise.  Don't change your meds for now.  Take care.  Glad to see you.

## 2017-10-20 NOTE — Assessment & Plan Note (Signed)
Hx of, he'll ask Dr. Benjamine Mola in general about follow up.

## 2017-10-20 NOTE — Assessment & Plan Note (Signed)
Living will d/w pt.  Wife designated if patient were incapacitated.   ?

## 2017-10-20 NOTE — Assessment & Plan Note (Signed)
Tetanus 2012 Flu at work PNA and shingles not due.  Not due for colon or prostate cancer screening. Living will d/w pt. Wife designated if patient were incapacitated. Diet and exercise. He's working on both.  [facility] HCV and HIV testing would have been done at that point.

## 2017-10-20 NOTE — Assessment & Plan Note (Signed)
No change in meds otherwise.  Discussed with patient about diet and exercise and labs.  Continue current medication.

## 2017-10-20 NOTE — Assessment & Plan Note (Signed)
If any apnea (he'll ask his wife) then he'll let me know and we'll send him to pulmonary or Dr. Benjamine Mola.   No change in meds otherwise.  Discussed with patient about diet and exercise and labs.

## 2018-04-23 LAB — HM HEPATITIS C SCREENING LAB: HM Hepatitis Screen: NEGATIVE

## 2018-05-13 ENCOUNTER — Telehealth: Payer: Self-pay | Admitting: Family Medicine

## 2018-05-13 DIAGNOSIS — R0683 Snoring: Secondary | ICD-10-CM

## 2018-05-13 NOTE — Telephone Encounter (Signed)
Pt called office due to speaking with Dr.Duncan previously. Pt was told to call back in if he did decide to do a sleep study. Pt is requesting Dr.Duncan to put in a order.

## 2018-05-14 NOTE — Addendum Note (Signed)
Addended by: Tonia Ghent on: 05/14/2018 03:21 PM   Modules accepted: Orders

## 2018-05-14 NOTE — Telephone Encounter (Signed)
Left message on voicemail for patient to call back. 

## 2018-05-14 NOTE — Telephone Encounter (Signed)
I put in the referral to see pulmonary re: sleep study.  Thanks.

## 2018-05-27 ENCOUNTER — Encounter: Payer: Self-pay | Admitting: Internal Medicine

## 2018-05-27 ENCOUNTER — Ambulatory Visit: Payer: BC Managed Care – PPO | Admitting: Internal Medicine

## 2018-05-27 VITALS — BP 126/80 | HR 70 | Resp 16 | Ht 72.0 in | Wt 220.0 lb

## 2018-05-27 DIAGNOSIS — G4719 Other hypersomnia: Secondary | ICD-10-CM

## 2018-05-27 NOTE — Progress Notes (Addendum)
[facility] Consultation      Assessment and Plan:  Excessive daytime sleepiness. - Symptoms and signs of obstructive sleep apnea. - We will send for sleep study, start on CPAP as appropriate. Addendum: HST denied, split night ordered.   Essential hypertension. - Sleep apnea can contribute to above condition, therefore treatment of sleep apnea is important part of its management.  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 - Patient has possible shift work sleep disorder as he works and multiple daytime and nighttime shifts. - Discussed that if his sleep study is negative or his daytime sleepiness persists after treatment of sleep apnea, we will may need to discuss further strategies to treat this.  Orders Placed This Encounter  Procedures  . Home sleep test   Return in about 3 months (around 08/25/2018).   Date: 05/27/2018  MRN# [id number] Roberto Black [birth year]    Roberto Black is a 43 y.o. old male seen in consultation for chief complaint of:    Chief Complaint  Patient presents with  . Consult  Referred by Dr. Damita Dunnings for snoring.   HPI:   Pt notes that he has been having a lot of snoring which has been getting worse over the years. He had a carotid body tumor removed in 2008, and his swallowing was effected somewhat and feels that his snoring has gotten worse since that time.  He is sleepy during the day he goes between days and nights, working as a Marine scientist at [employer]. He works for a [employer]rop[employer] so has to work for 24 hr shifts.  He has never been tested for OSA in the past.  Denies sleep paralysis, denies cataplexy, no sleep walking, denies jaw pain but does have jaw popping, no TMJ.     PMHX:   Past Medical History:  Diagnosis Date  . Hypertension   . Hypertriglyceridemia    Surgical Hx:  Past Surgical History:  Procedure Laterality Date  . CAROTID BODY TUMOR EXCISION  2008   right, benign  . VASECTOMY     Family Hx:  Family History    Problem Relation Age of Onset  . Hyperlipidemia Father   . Hyperlipidemia Brother   . Colon cancer Neg Hx   . Prostate cancer Neg Hx    Social Hx:   Social History   Tobacco Use  . Smoking status: Never Smoker  . Smokeless tobacco: Never Used  Substance Use Topics  . Alcohol use: Yes    Comment: very rare  . Drug use: No   Medication:    Current Outpatient Medications:  .  desonide (DESOWEN) 0.05 % lotion, Apply topically 2 (two) times daily. As needed.  Limit use as much as possible., Disp: 118 mL, Rfl: 3 .  fenofibrate (TRICOR) 145 MG tablet, Take 1 tablet (145 mg total) by mouth daily., Disp: 90 tablet, Rfl: 3 .  losartan (COZAAR) 50 MG tablet, TAKE 1 TABLET (50 MG TOTAL) BY MOUTH DAILY., Disp: 90 tablet, Rfl: 3   Allergies:  Patient has no known allergies.  Review of Systems: Gen:  Denies  fever, sweats, chills HEENT: Denies blurred vision, double vision. bleeds, sore throat Cvc:  No dizziness, chest pain. Resp:   Denies cough or sputum production, shortness of breath Gi: Denies swallowing difficulty, stomach pain. Gu:  Denies bladder incontinence, burning urine Ext:   No Joint pain, stiffness. Skin: No skin rash,  hives  Endoc:  No polyuria, polydipsia. Psych: No depression, insomnia. Other:  All other systems  were reviewed with the patient and were negative other that what is mentioned in the HPI.   Physical Examination:   VS: BP 126/80 (BP Location: Left Arm, Cuff Size: Large)   Pulse 70   Resp 16   Ht 6' (1.829 m)   Wt 220 lb (99.8 kg)   SpO2 96%   BMI 29.84 kg/m   General Appearance: No distress  Neuro:without focal findings,  speech normal,  HEENT: PERRLA, EOM intact.   Pulmonary: normal breath sounds, No wheezing.  CardiovascularNormal S1,S2.  No m/r/g.   Abdomen: Benign, Soft, non-tender. Renal:  No costovertebral tenderness  GU:  No performed at this time. Endoc: No evident thyromegaly, no signs of acromegaly. Skin:   warm, no rashes, no  ecchymosis  Extremities: normal, no cyanosis, clubbing.  Other findings:    LABORATORY PANEL:   CBC No results for input(s): WBC, HGB, HCT, PLT in the last 168 hours. ------------------------------------------------------------------------------------------------------------------  Chemistries  No results for input(s): NA, K, CL, CO2, GLUCOSE, BUN, CREATININE, CALCIUM, MG, AST, ALT, ALKPHOS, BILITOT in the last 168 hours.  Invalid input(s): GFRCGP ------------------------------------------------------------------------------------------------------------------  Cardiac Enzymes No results for input(s): TROPONINI in the last 168 hours. ------------------------------------------------------------  RADIOLOGY:  No results found.     Thank  you for the consultation and for allowing [facility] to assist in the care of your patient. Our recommendations are noted above.  Please contact us if we can be of further service.   Marda Stalker, M.D., F.C.C.P.  Board Certified in Internal Medicine, Pulmonary Medicine, [place], and Sleep Medicine.  [facility] Office Number: [phone]   05/27/2018

## 2018-05-27 NOTE — Patient Instructions (Signed)

## 2018-06-02 ENCOUNTER — Telehealth: Payer: Self-pay | Admitting: Internal Medicine

## 2018-06-02 NOTE — Telephone Encounter (Signed)
Called and spoke with patient and advised patient that HST has been denied by [facility].  In order to proceed and address his issues an in lab study will need to be ordered.  Pt voiced understanding and is willing to proceed with in lab study.  Order placed by Dr. Ashby Dawes. Pt is aware. Form completed and sent to Sleep Med. Rhonda J Cobb

## 2018-06-02 NOTE — Telephone Encounter (Signed)
Ordered split night study.

## 2018-06-02 NOTE — Telephone Encounter (Signed)
[place] [employer] denied HST. Pt will need an in lab study to diagnosis patient symptoms.  Please advise. Rhonda J Cobb

## 2018-06-02 NOTE — Addendum Note (Signed)
Addended by: Laverle Hobby on: 06/02/2018 01:19 PM   Modules accepted: Orders

## 2018-07-09 ENCOUNTER — Telehealth: Payer: Self-pay | Admitting: Internal Medicine

## 2018-07-09 DIAGNOSIS — G4733 Obstructive sleep apnea (adult) (pediatric): Secondary | ICD-10-CM

## 2018-07-09 NOTE — Telephone Encounter (Signed)
Pt called and stated that on 07/01/2018 that he spoke with Berniece Andreas (coder) in the [facility] about the cost of the [employer] (which was denied by [employer]) vs the in lab study.  After discussing the cost of each patient stated that he would just pay out of pocket for the HST.  I advised patient that if he proceeded with this that [facility] probably would not cover the CPAP device if needed. Pt stated that he would just obtain Rx for the device and purchase the CPAP on line.  Advised patient that he would not have a RT to change the pressure if needed and may have to pay DME company to do this.  Pt stated that he was aware of this per Kathlee Nations T and wanted to proceed with the HST.    Pt was scheduled for HST on 07/08/2018 and pt picked up the device and study was preformed.   Just FYI   Carlos Levering Cobb

## 2018-07-10 DIAGNOSIS — G4733 Obstructive sleep apnea (adult) (pediatric): Secondary | ICD-10-CM

## 2018-07-15 ENCOUNTER — Telehealth: Payer: Self-pay | Admitting: Internal Medicine

## 2018-07-15 DIAGNOSIS — G4733 Obstructive sleep apnea (adult) (pediatric): Secondary | ICD-10-CM

## 2018-07-15 NOTE — Telephone Encounter (Signed)
Spoke to patient regarding his sleep study results.  Mild OSA with AHI of 6. OSA was positional, only seen in the supine position.   Recommend auto-CPAP with pressure range of 5-15 cm H2O.  After speaking to patient, he wishes to proceed with CPAP therapy, but he would like a prescription and he will take care of getting the device himself. Let him know we will get rx signed and let him know when it was ready for pickup.

## 2018-07-16 NOTE — Telephone Encounter (Signed)
Rx for cpap has been signed and placed up front for pick up. Lm to make pt aware.

## 2018-07-16 NOTE — Telephone Encounter (Signed)
Pt is aware. Nothing further needed 

## 2018-07-25 ENCOUNTER — Other Ambulatory Visit: Payer: Self-pay

## 2018-07-25 DIAGNOSIS — G4719 Other hypersomnia: Secondary | ICD-10-CM

## 2019-01-03 ENCOUNTER — Other Ambulatory Visit: Payer: Self-pay | Admitting: Family Medicine

## 2019-01-03 DIAGNOSIS — E781 Pure hyperglyceridemia: Secondary | ICD-10-CM

## 2019-01-04 ENCOUNTER — Other Ambulatory Visit: Payer: Self-pay | Admitting: Family Medicine

## 2019-01-31 ENCOUNTER — Other Ambulatory Visit: Payer: Self-pay | Admitting: Family Medicine

## 2019-01-31 DIAGNOSIS — E781 Pure hyperglyceridemia: Secondary | ICD-10-CM

## 2019-03-07 ENCOUNTER — Other Ambulatory Visit: Payer: Self-pay | Admitting: Family Medicine

## 2019-03-07 DIAGNOSIS — E781 Pure hyperglyceridemia: Secondary | ICD-10-CM

## 2019-03-30 ENCOUNTER — Other Ambulatory Visit: Payer: Self-pay | Admitting: Family Medicine

## 2019-03-30 ENCOUNTER — Telehealth: Payer: Self-pay

## 2019-03-30 DIAGNOSIS — I10 Essential (primary) hypertension: Secondary | ICD-10-CM

## 2019-03-30 DIAGNOSIS — E781 Pure hyperglyceridemia: Secondary | ICD-10-CM

## 2019-03-30 NOTE — Telephone Encounter (Signed)
LVM w COVID screen, front door and back lab info 12.7.2020 TLJ

## 2019-03-31 ENCOUNTER — Other Ambulatory Visit (INDEPENDENT_AMBULATORY_CARE_PROVIDER_SITE_OTHER): Payer: BC Managed Care – PPO

## 2019-03-31 ENCOUNTER — Other Ambulatory Visit: Payer: Self-pay

## 2019-03-31 DIAGNOSIS — E781 Pure hyperglyceridemia: Secondary | ICD-10-CM | POA: Diagnosis not present

## 2019-03-31 DIAGNOSIS — I10 Essential (primary) hypertension: Secondary | ICD-10-CM

## 2019-03-31 LAB — COMPREHENSIVE METABOLIC PANEL
ALT: 37 U/L (ref 0–53)
AST: 19 U/L (ref 0–37)
Albumin: 4.5 g/dL (ref 3.5–5.2)
Alkaline Phosphatase: 46 U/L (ref 39–117)
BUN: 15 mg/dL (ref 6–23)
CO2: 28 mEq/L (ref 19–32)
Calcium: 9.5 mg/dL (ref 8.4–10.5)
Chloride: 104 mEq/L (ref 96–112)
Creatinine, Ser: 0.97 mg/dL (ref 0.40–1.50)
GFR: 83.7 mL/min (ref >60.00)
Glucose, Bld: 93 mg/dL (ref 70–99)
Potassium: 4.6 mEq/L (ref 3.5–5.1)
Sodium: 137 mEq/L (ref 135–145)
Total Bilirubin: 0.4 mg/dL (ref 0.2–1.2)
Total Protein: 7 g/dL (ref 6.0–8.3)

## 2019-03-31 LAB — LIPID PANEL
Cholesterol: 141 mg/dL (ref 0–200)
HDL: 30.2 mg/dL — ABNORMAL LOW (ref >39.00)
LDL Cholesterol: 77 mg/dL (ref 0–99)
NonHDL: 110.57
Total CHOL/HDL Ratio: 5
Triglycerides: 168 mg/dL — ABNORMAL HIGH (ref 0.0–149.0)
VLDL: 33.6 mg/dL (ref 0.0–40.0)

## 2019-04-01 ENCOUNTER — Other Ambulatory Visit: Payer: BC Managed Care – PPO

## 2019-04-06 ENCOUNTER — Other Ambulatory Visit: Payer: Self-pay

## 2019-04-06 ENCOUNTER — Encounter: Payer: Self-pay | Admitting: Family Medicine

## 2019-04-06 ENCOUNTER — Ambulatory Visit (INDEPENDENT_AMBULATORY_CARE_PROVIDER_SITE_OTHER): Payer: BC Managed Care – PPO | Admitting: Family Medicine

## 2019-04-06 VITALS — BP 120/72 | HR 89 | Temp 97.3°F | Ht 72.0 in | Wt 220.0 lb

## 2019-04-06 DIAGNOSIS — D446 Neoplasm of uncertain behavior of carotid body: Secondary | ICD-10-CM

## 2019-04-06 DIAGNOSIS — E781 Pure hyperglyceridemia: Secondary | ICD-10-CM

## 2019-04-06 DIAGNOSIS — Z0000 Encounter for general adult medical examination without abnormal findings: Secondary | ICD-10-CM | POA: Diagnosis not present

## 2019-04-06 DIAGNOSIS — Z7189 Other specified counseling: Secondary | ICD-10-CM

## 2019-04-06 DIAGNOSIS — I10 Essential (primary) hypertension: Secondary | ICD-10-CM

## 2019-04-06 MED ORDER — LOSARTAN POTASSIUM 25 MG PO TABS: 50.0000 mg | ORAL_TABLET | Freq: Every day | ORAL | 3 refills | Status: DC

## 2019-04-06 MED ORDER — FENOFIBRATE 145 MG PO TABS: 145.0000 mg | ORAL_TABLET | Freq: Every day | ORAL | 3 refills | Status: DC

## 2019-04-06 MED ORDER — DESONIDE 0.05 % EX LOTN: TOPICAL_LOTION | Freq: Two times a day (BID) | CUTANEOUS | 3 refills | Status: DC

## 2019-04-06 NOTE — Patient Instructions (Signed)
Thanks for your effort.  Take care.  Glad to see you.  Update me as needed.  Don't change your meds for now.

## 2019-04-06 NOTE — Progress Notes (Signed)
This visit occurred during the SARS-CoV-2 public health emergency.  Safety protocols were in place, including screening questions prior to the visit, additional usage of staff PPE, and extensive cleaning of exam room while observing appropriate contact time as indicated for disinfecting solutions.  CPE- See plan.  Routine anticipatory guidance given to patient.  See health maintenance.  The possibility exists that previously documented standard health maintenance information may have been brought forward from a previous encounter into this note.  If needed, that same information has been updated to reflect the current situation based on today's encounter.    Tetanus 2012 Flu 2020 at work, at [facility].   PNA and shingles not due.  Not due for colon or prostate cancer screening. Living will d/w pt. Wife designated if patient were incapacitated. Diet and exercise. He's working on both.  [facility] HCV and HIV testing would have been done at that point.   Hypertension:    Using medication without problems or lightheadedness: yes Chest pain with exertion:no Edema:no Short of breath:no  Elevated Cholesterol: Using medications without problems: yes Muscle aches: no Diet compliance: yes Exercise:yes Labs d/w pt.    Carotid body f/u deferred given covid, d/w pt.    PMH and SH reviewed  Meds, vitals, and allergies reviewed.   ROS: Per HPI.  Unless specifically indicated otherwise in HPI, the patient denies:  General: fever. Eyes: acute vision changes ENT: sore throat Cardiovascular: chest pain Respiratory: SOB GI: vomiting GU: dysuria Musculoskeletal: acute back pain Derm: acute rash Neuro: acute motor dysfunction Psych: worsening mood Endocrine: polydipsia Heme: bleeding Allergy: hayfever  GEN: nad, alert and oriented HEENT: ncat NECK: supple w/o LA CV: rrr. PULM: ctab, no inc wob ABD: soft, +bs EXT: no edema SKIN: no acute rash

## 2019-04-08 NOTE — Assessment & Plan Note (Signed)
No change in meds.  Continue work on diet and exercise.  He agrees. 

## 2019-04-08 NOTE — Assessment & Plan Note (Signed)
Tetanus 2012 Flu 2020 at work, at [facility].   PNA and shingles not due.  Not due for colon or prostate cancer screening. Living will d/w pt. Wife designated if patient were incapacitated. Diet and exercise. He's working on both.  [facility]. HCV and HIV testing would have been done at that point.

## 2019-04-08 NOTE — Assessment & Plan Note (Signed)
Living will d/w pt.  Wife designated if patient were incapacitated.   ?

## 2019-04-08 NOTE — Assessment & Plan Note (Signed)
Discussed.  He will call about follow-up when possible.  No new symptoms.

## 2019-05-20 ENCOUNTER — Other Ambulatory Visit: Payer: Self-pay

## 2020-02-26 ENCOUNTER — Other Ambulatory Visit: Payer: Self-pay

## 2020-02-26 ENCOUNTER — Encounter: Payer: Self-pay | Admitting: Family Medicine

## 2020-02-26 ENCOUNTER — Ambulatory Visit: Payer: BC Managed Care – PPO | Admitting: Family Medicine

## 2020-02-26 VITALS — BP 136/80 | HR 88 | Temp 97.1°F | Wt 223.4 lb

## 2020-02-26 DIAGNOSIS — H9319 Tinnitus, unspecified ear: Secondary | ICD-10-CM | POA: Diagnosis not present

## 2020-02-26 LAB — COMPREHENSIVE METABOLIC PANEL
ALT: 53 U/L (ref 0–53)
AST: 28 U/L (ref 0–37)
Albumin: 4.7 g/dL (ref 3.5–5.2)
Alkaline Phosphatase: 50 U/L (ref 39–117)
BUN: 15 mg/dL (ref 6–23)
CO2: 28 mEq/L (ref 19–32)
Calcium: 10.1 mg/dL (ref 8.4–10.5)
Chloride: 103 mEq/L (ref 96–112)
Creatinine, Ser: 0.91 mg/dL (ref 0.40–1.50)
GFR: 101.52 mL/min (ref >60.00)
Glucose, Bld: 163 mg/dL — ABNORMAL HIGH (ref 70–99)
Potassium: 4.5 mEq/L (ref 3.5–5.1)
Sodium: 139 mEq/L (ref 135–145)
Total Bilirubin: 0.4 mg/dL (ref 0.2–1.2)
Total Protein: 7.4 g/dL (ref 6.0–8.3)

## 2020-02-26 LAB — CBC WITH DIFFERENTIAL/PLATELET
Basophils Absolute: 0.1 10*3/uL (ref 0.0–0.1)
Basophils Relative: 1 % (ref 0.0–3.0)
Eosinophils Absolute: 0.2 10*3/uL (ref 0.0–0.7)
Eosinophils Relative: 3.3 % (ref 0.0–5.0)
HCT: 46 % (ref 39.0–52.0)
Hemoglobin: 15.8 g/dL (ref 13.0–17.0)
Lymphocytes Relative: 29.9 % (ref 12.0–46.0)
Lymphs Abs: 1.6 10*3/uL (ref 0.7–4.0)
MCHC: 34.3 g/dL (ref 30.0–36.0)
MCV: 91.1 fl (ref 78.0–100.0)
Monocytes Absolute: 0.6 10*3/uL (ref 0.1–1.0)
Monocytes Relative: 10.3 % (ref 3.0–12.0)
Neutro Abs: 3 10*3/uL (ref 1.4–7.7)
Neutrophils Relative %: 55.5 % (ref 43.0–77.0)
Platelets: 246 10*3/uL (ref 150.0–400.0)
RBC: 5.04 Mil/uL (ref 4.22–5.81)
RDW: 13.9 % (ref 11.5–15.5)
WBC: 5.5 10*3/uL (ref 4.0–10.5)

## 2020-02-26 LAB — VITAMIN D 25 HYDROXY (VIT D DEFICIENCY, FRACTURES): VITD: 25.43 ng/mL — ABNORMAL LOW (ref 30.00–100.00)

## 2020-02-26 MED ORDER — HYDROCHLOROTHIAZIDE 12.5 MG PO TABS
12.5000 mg | ORAL_TABLET | Freq: Every day | ORAL | 3 refills | Status: DC
Start: 2020-02-26 — End: 2020-04-08

## 2020-02-26 NOTE — Progress Notes (Signed)
This visit occurred during the SARS-CoV-2 public health emergency.  Safety protocols were in place, including screening questions prior to the visit, additional usage of staff PPE, and extensive cleaning of exam room while observing appropriate contact time as indicated for disinfecting solutions.  Ear sx.  Going on for a few months.  He has annual hearing screen at work due to noise exposure- he was told it was stable but he doesn't have the report yet.  He can hear in a room with normal conversation.  A while back he noted hear ringing in B ears, about 5-6 months ago.  He has used masking/noise machine in the meantime.  Wife noted that it was annoying him.  No new meds.  He has normal TM movement.  No FCNAVD.   He had covid 11/2019.  He didn't have residual sx after recovery.  He was prev vaccinated.    Losartan has rate of tinnitis <2%, not reported with fenofibrate.  I don't see tinnitus listed with HCTZ, d/w pt.   His father recently had cardiac evaluation.  The patient was already aware of his father's results and I did not disclose any confidential information.  Meds, vitals, and allergies reviewed.   ROS: Per HPI unless specifically indicated in ROS section   nad ncat Neck supple, no LA Air > bone B.  Weber symmetric.   TM wnl B.  Normal TM movement on valsalva Minimal wax on R canal, none on the L rrr ctab

## 2020-02-26 NOTE — Patient Instructions (Signed)
Go to the lab on the way out.   If you have mychart we'll likely use that to update you.    We'll call about seeing ENT.  Stop losartan.  Change to HCTZ 12.5mg  a day.  If BP is still >140/>90, then increase to 25mg  a day.  Take care.  Glad to see you.

## 2020-02-28 ENCOUNTER — Encounter: Payer: Self-pay | Admitting: Family Medicine

## 2020-02-28 DIAGNOSIS — H9319 Tinnitus, unspecified ear: Secondary | ICD-10-CM | POA: Insufficient documentation

## 2020-02-28 MED ORDER — VITAMIN D 50 MCG (2000 UT) PO CAPS: 2000.0000 [IU] | ORAL_CAPSULE | Freq: Every day | ORAL | Status: AC

## 2020-02-28 NOTE — Assessment & Plan Note (Signed)
Refer to ENT.  Recheck routine labs today.  Losartan has rate of tinnitis <2%, not reported with fenofibrate.  I don't see tinnitus listed with HCTZ, d/w pt. reasonable to change to hydrochlorothiazide and stop losartan.  If his blood pressures not controlled on 12.5 mg of hydrochlorothiazide he can decrease to 25 mg and update me as needed.  He agrees to plan.

## 2020-03-11 ENCOUNTER — Other Ambulatory Visit: Payer: Self-pay | Admitting: Internal Medicine

## 2020-03-11 ENCOUNTER — Ambulatory Visit: Payer: BC Managed Care – PPO | Attending: Internal Medicine

## 2020-03-11 DIAGNOSIS — Z23 Encounter for immunization: Secondary | ICD-10-CM

## 2020-03-11 NOTE — Progress Notes (Signed)
   Covid-19 Vaccination Clinic  Name:  Roberto Black    MRN: [id number] DOB: 1975-06-15  03/11/2020  [name] was observed post Covid-19 immunization for 15 minutes without incident. He was provided with Vaccine Information Sheet and instruction to access the V-Safe system.   [name] was instructed to call 911 with any severe reactions post vaccine: Marland Kitchen Difficulty breathing  . Swelling of face and throat  . A fast heartbeat  . A bad rash all over body  . Dizziness and weakness   Immunizations Administered    No immunizations on file.

## 2020-03-24 ENCOUNTER — Other Ambulatory Visit: Payer: Self-pay | Admitting: Family Medicine

## 2020-03-24 DIAGNOSIS — I10 Essential (primary) hypertension: Secondary | ICD-10-CM

## 2020-03-24 DIAGNOSIS — R7989 Other specified abnormal findings of blood chemistry: Secondary | ICD-10-CM

## 2020-03-24 DIAGNOSIS — R739 Hyperglycemia, unspecified: Secondary | ICD-10-CM

## 2020-04-05 ENCOUNTER — Other Ambulatory Visit: Payer: Self-pay

## 2020-04-05 ENCOUNTER — Other Ambulatory Visit (INDEPENDENT_AMBULATORY_CARE_PROVIDER_SITE_OTHER): Payer: BC Managed Care – PPO

## 2020-04-05 DIAGNOSIS — I10 Essential (primary) hypertension: Secondary | ICD-10-CM | POA: Diagnosis not present

## 2020-04-05 DIAGNOSIS — R739 Hyperglycemia, unspecified: Secondary | ICD-10-CM

## 2020-04-05 DIAGNOSIS — R7989 Other specified abnormal findings of blood chemistry: Secondary | ICD-10-CM | POA: Diagnosis not present

## 2020-04-05 LAB — GLUCOSE, RANDOM: Glucose, Bld: 86 mg/dL (ref 70–99)

## 2020-04-05 LAB — LIPID PANEL
Cholesterol: 129 mg/dL (ref 0–200)
HDL: 23.7 mg/dL — ABNORMAL LOW (ref >39.00)
NonHDL: 105.77
Total CHOL/HDL Ratio: 5
Triglycerides: 302 mg/dL — ABNORMAL HIGH (ref 0.0–149.0)
VLDL: 60.4 mg/dL — ABNORMAL HIGH (ref 0.0–40.0)

## 2020-04-05 LAB — LDL CHOLESTEROL, DIRECT: Direct LDL: 73 mg/dL

## 2020-04-05 LAB — HEMOGLOBIN A1C: Hgb A1c MFr Bld: 5.5 % (ref 4.6–6.5)

## 2020-04-05 LAB — VITAMIN D 25 HYDROXY (VIT D DEFICIENCY, FRACTURES): VITD: 26.66 ng/mL — ABNORMAL LOW (ref 30.00–100.00)

## 2020-04-08 ENCOUNTER — Ambulatory Visit (INDEPENDENT_AMBULATORY_CARE_PROVIDER_SITE_OTHER): Payer: BC Managed Care – PPO | Admitting: Family Medicine

## 2020-04-08 ENCOUNTER — Encounter: Payer: Self-pay | Admitting: Family Medicine

## 2020-04-08 ENCOUNTER — Other Ambulatory Visit: Payer: Self-pay

## 2020-04-08 VITALS — BP 130/82 | HR 80 | Temp 96.0°F | Ht 72.0 in | Wt 218.3 lb

## 2020-04-08 DIAGNOSIS — Z8249 Family history of ischemic heart disease and other diseases of the circulatory system: Secondary | ICD-10-CM

## 2020-04-08 DIAGNOSIS — D446 Neoplasm of uncertain behavior of carotid body: Secondary | ICD-10-CM

## 2020-04-08 DIAGNOSIS — Z0000 Encounter for general adult medical examination without abnormal findings: Secondary | ICD-10-CM | POA: Diagnosis not present

## 2020-04-08 DIAGNOSIS — Z7189 Other specified counseling: Secondary | ICD-10-CM

## 2020-04-08 DIAGNOSIS — I10 Essential (primary) hypertension: Secondary | ICD-10-CM

## 2020-04-08 DIAGNOSIS — H9319 Tinnitus, unspecified ear: Secondary | ICD-10-CM

## 2020-04-08 DIAGNOSIS — E781 Pure hyperglyceridemia: Secondary | ICD-10-CM

## 2020-04-08 MED ORDER — LOSARTAN POTASSIUM 50 MG PO TABS
50.0000 mg | ORAL_TABLET | Freq: Every day | ORAL | 3 refills | Status: DC
Start: 2020-04-08 — End: 2021-04-10

## 2020-04-08 MED ORDER — FENOFIBRATE 145 MG PO TABS: 145.0000 mg | ORAL_TABLET | Freq: Every day | ORAL | 3 refills | Status: DC

## 2020-04-08 MED ORDER — HYDROCHLOROTHIAZIDE 12.5 MG PO TABS: 12.5000 mg | ORAL_TABLET | Freq: Every day | ORAL | Status: DC

## 2020-04-08 NOTE — Progress Notes (Signed)
This visit occurred during the SARS-CoV-2 public health emergency.  Safety protocols were in place, including screening questions prior to the visit, additional usage of staff PPE, and extensive cleaning of exam room while observing appropriate contact time as indicated for disinfecting solutions.  CPE- See plan.  Routine anticipatory guidance given to patient.  See health maintenance.  The possibility exists that previously documented standard health maintenance information may have been brought forward from a previous encounter into this note.  If needed, that same information has been updated to reflect the current situation based on today's encounter.    Tetanus 2012 Flu 2021 PNA and shingles not due.  covid vaccine 2021.   Not due for colon or prostate cancer screening. Living will d/w pt. Wife designated if patient were incapacitated. Diet and exercise. He's working on both.  [facility] HCV and HIV testing would have been done at that point.  He had f/u with Dr. Benjamine Mola last week.  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  Stopping ARB didn't help with tinnitus.  He is going to see about get molded plugs.    Hypertension:    Using medication without problems or lightheadedness:yes, taking 25mg  HCTZ.   Chest pain with exertion:no Edema:no Short of breath:no He has more urination with HCTZ 25.    FH CAD and lipids d/w pt.  Still on fenofibrate at baseline.  No aches on fenofibrate.    PMH and SH reviewed  Meds, vitals, and allergies reviewed.   ROS: Per HPI.  Unless specifically indicated otherwise in HPI, the patient denies:  General: fever. Eyes: acute vision changes ENT: sore throat Cardiovascular: chest pain Respiratory: SOB GI: vomiting GU: dysuria Musculoskeletal: acute back pain Derm: acute rash Neuro: acute motor dysfunction Psych: worsening mood Endocrine: polydipsia Heme:  bleeding Allergy: hayfever  GEN: nad, alert and oriented HEENT: ncat NECK: supple w/o LA, old postsurgical changes noted at baseline. CV: rrr. PULM: ctab, no inc wob ABD: soft, +bs EXT: no edema SKIN: no acute rash

## 2020-04-08 NOTE — Patient Instructions (Addendum)
We'll call about seeing cardiology.   Cut back on HCTZ and add back losartan.  Take care.  Glad to see you.

## 2020-04-10 NOTE — Assessment & Plan Note (Signed)
Living will d/w pt.  Wife designated if patient were incapacitated.   ?

## 2020-04-10 NOTE — Assessment & Plan Note (Signed)
Family history and labs discussed with patient.  Continue fenofibrate for now but refer to cardiology for both his family history of cardiovascular disease and for lipid management consideration.

## 2020-04-10 NOTE — Assessment & Plan Note (Signed)
Tetanus 2012 Flu 2021 PNA and shingles not due.  covid vaccine 2021.   Not due for colon or prostate cancer screening. Living will d/w pt. Wife designated if patient were incapacitated. Diet and exercise. He's working on both.  [facility] HCV and HIV testing would have been done at that point.

## 2020-04-10 NOTE — Assessment & Plan Note (Signed)
Stopping losartan did not change his issue with tinnitus.  Restart losartan 50 mg, decrease hydrochlorothiazide to 12.5 mg a day.  Continue work on diet and exercise.  He agrees.

## 2020-04-10 NOTE — Assessment & Plan Note (Signed)
He had f/u with Dr. Benjamine Mola last week.  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  Stopping ARB didn't help with tinnitus.  He is going to see about get molded plugs.   See blood pressure discussion regarding medication changes.

## 2020-04-10 NOTE — Assessment & Plan Note (Signed)
History of.  Status post excision.  He had follow-up with ENT.  I will defer.  He agrees.

## 2020-04-26 ENCOUNTER — Ambulatory Visit: Payer: BC Managed Care – PPO | Admitting: Cardiology

## 2020-04-27 ENCOUNTER — Other Ambulatory Visit: Payer: Self-pay

## 2020-04-27 ENCOUNTER — Ambulatory Visit: Payer: BC Managed Care – PPO | Admitting: Internal Medicine

## 2020-04-27 ENCOUNTER — Encounter: Payer: Self-pay | Admitting: Internal Medicine

## 2020-04-27 VITALS — BP 128/90 | HR 83 | Ht 72.0 in | Wt 222.2 lb

## 2020-04-27 DIAGNOSIS — Z9189 Other specified personal risk factors, not elsewhere classified: Secondary | ICD-10-CM

## 2020-04-27 DIAGNOSIS — I10 Essential (primary) hypertension: Secondary | ICD-10-CM

## 2020-04-27 DIAGNOSIS — Z8249 Family history of ischemic heart disease and other diseases of the circulatory system: Secondary | ICD-10-CM

## 2020-04-27 DIAGNOSIS — E781 Pure hyperglyceridemia: Secondary | ICD-10-CM | POA: Diagnosis not present

## 2020-04-27 NOTE — Patient Instructions (Signed)
Medication Instructions:  Your physician recommends that you continue on your current medications as directed. Please refer to the Current Medication list given to you today.  *If you need a refill on your cardiac medications before your next appointment, please call your pharmacy*  Lab Work: none If you have labs (blood work) drawn today and your tests are completely normal, you will receive your results only by: Marland Kitchen MyChart Message (if you have MyChart) OR . A paper copy in the mail If you have any lab test that is abnormal or we need to change your treatment, we will call you to review the results.   Testing/Procedures: Your physician has order a CT Calcium Scoring. This procedure uses special x-ray equipment to produce pictures of the coronary arteries to determine if they are blocked or narrowed by the buildup of plaque - an indicator for atherosclerosis or coronary artery disease (CAD).  $99.00 at our [facility] in [place]  Please call [phone] to schedule.   [facility] [address] [place], [place] [address]   Follow-Up: At [facility], you and your health needs are our priority.  As part of our continuing mission to provide you with exceptional heart care, we have created designated Provider Care Teams.  These Care Teams include your primary Cardiologist (physician) and Advanced Practice Providers (APPs -  Physician Assistants and Nurse Practitioners) who all work together to provide you with the care you need, when you need it.  We recommend signing up for the patient portal called "MyChart".  Sign up information is provided on this After Visit Summary.  MyChart is used to connect with patients for Virtual Visits (Telemedicine).  Patients are able to view lab/test results, encounter notes, upcoming appointments, etc.  Non-urgent messages can be sent to your provider as well.   To learn more about what you can do with MyChart,  go to [email or web address].    Your next appointment:   12 month(s)  The format for your next appointment:   In Person  Provider:   You may see DR Cristal Deer END or one of the following Advanced Practice Providers on your designated Care Team:    Nicolasa Ducking, NP  Eula Listen, PA-C  Marisue Ivan, PA-C  Cadence Fransico Michael, [place]  Gillian Shields, NP    Coronary Calcium Scan A coronary calcium scan is an imaging test used to look for deposits of plaque in the inner lining of the blood vessels of the heart (coronary arteries). Plaque is made up of calcium, protein, and fatty substances. These deposits of plaque can partly clog and narrow the coronary arteries without producing any symptoms or warning signs. This puts a person at risk for a heart attack. This test is recommended for people who are at moderate risk for heart disease. The test can find plaque deposits before symptoms develop. Tell a health care provider about:  Any allergies you have.  All medicines you are taking, including vitamins, herbs, eye drops, creams, and over-the-counter medicines.  Any problems you or family members have had with anesthetic medicines.  Any blood disorders you have.  Any surgeries you have had.  Any medical conditions you have.  Whether you are pregnant or may be pregnant. What are the risks? Generally, this is a safe procedure. However, problems may occur, including:  Harm to a pregnant woman and her unborn baby. This test involves the use of radiation. Radiation exposure can be dangerous to a pregnant woman and her  unborn baby. If you are pregnant or think you may be pregnant, you should not have this procedure done.  Slight increase in the risk of cancer. This is because of the radiation involved in the test. What happens before the procedure? Ask your health care provider for any specific instructions on how to prepare for this procedure. You may be asked to avoid products  that contain caffeine, tobacco, or nicotine for 4 hours before the procedure. What happens during the procedure?   You will undress and remove any jewelry from your neck or chest.  You will put on a hospital gown.  Sticky electrodes will be placed on your chest. The electrodes will be connected to an electrocardiogram (ECG) machine to record a tracing of the electrical activity of your heart.  You will lie down on a curved bed that is attached to the [place].  You may be given medicine to slow down your heart rate so that clear pictures can be created.  You will be moved into the CT scanner, and the CT scanner will take pictures of your heart. During this time, you will be asked to lie still and hold your breath for 2-3 seconds at a time while each picture of your heart is being taken. The procedure may vary among health care providers and hospitals. What happens after the procedure?  You can get dressed.  You can return to your normal activities.  It is up to you to get the results of your procedure. Ask your health care provider, or the department that is doing the procedure, when your results will be ready. Summary  A coronary calcium scan is an imaging test used to look for deposits of plaque in the inner lining of the blood vessels of the heart (coronary arteries). Plaque is made up of calcium, protein, and fatty substances.  Generally, this is a safe procedure. Tell your health care provider if you are pregnant or may be pregnant.  Ask your health care provider for any specific instructions on how to prepare for this procedure.  A CT scanner will take pictures of your heart.  You can return to your normal activities after the scan is done. This information is not intended to replace advice given to you by your health care provider. Make sure you discuss any questions you have with your health care provider. Document Revised: 10/28/2018 Document Reviewed:  10/28/2018 Elsevier Patient Education  [place].

## 2020-04-27 NOTE — Progress Notes (Unsigned)
New Outpatient Visit Date: 04/27/2020  Referring Provider: Tonia Ghent, MD [address] [place],  [place] [address]  Chief Complaint: Elevated triglycerides  HPI:  Mr. Felch is a 45 y.o. male who is being seen today for the evaluation of hypertriglyceridemia and family history of coronary artery disease at the request of Dr. Damita Dunnings. He has a history of hypertension and hypertriglyceridemia diagnosed at least 10 years ago.  Mr. Tierrablanca feels well but is concerned because his father was recently diagnosed with coronary artery disease and required a 5-vessel CABG at age 63 after having been very healthy most of his life.  Mr. Gottshall denies chest pain, shortness of breath, palpitations, lightheadedness, and edema.  He exercises regularly without any limitations.  He remains on fenofibrate but notes that his recent lipid panels have shown suboptimal control of his triglycerides.  --------------------------------------------------------------------------------------------------  Cardiovascular History & Procedures: Cardiovascular Problems:  Hypertriglyceridemia  Risk Factors:  Hypertriglyceridemia, hypertension, and male gender  Cath/PCI:  None  CV Surgery:  None  EP Procedures and Devices:  None  Non-Invasive Evaluation(s):  None  Recent CV Pertinent Labs: Lab Results  Component Value Date   CHOL 129 04/05/2020   HDL 23.70 (L) 04/05/2020   LDLCALC 77 03/31/2019   LDLDIRECT 73.0 04/05/2020   TRIG 302.0 (H) 04/05/2020   CHOLHDL 5 04/05/2020   INR 1.1 03/14/2007   K 4.5 02/26/2020   BUN 15 02/26/2020   CREATININE 0.91 02/26/2020    --------------------------------------------------------------------------------------------------  Past Medical History:  Diagnosis Date  . Asthma    childhood  . 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 Hypertension   . Hypertriglyceridemia     Past Surgical History:  Procedure Laterality Date  . CAROTID BODY TUMOR EXCISION   2008   right, benign  . VASECTOMY      Current Meds  Medication Sig  . Cholecalciferol (VITAMIN D) 50 MCG (2000 UT) CAPS Take 1 capsule (2,000 Units total) by mouth daily.  Marland Kitchen desonide (DESOWEN) 0.05 % lotion Apply topically 2 (two) times daily. As needed.  Limit use as much as possible.  . fenofibrate (TRICOR) 145 MG tablet Take 1 tablet (145 mg total) by mouth daily.  . hydrochlorothiazide (HYDRODIURIL) 12.5 MG tablet Take 1 tablet (12.5 mg total) by mouth daily.  Marland Kitchen losartan (COZAAR) 50 MG tablet Take 1 tablet (50 mg total) by mouth daily.    Allergies: Patient has no known allergies.  Social History   Tobacco Use  . Smoking status: Never Smoker  . Smokeless tobacco: Never Used  Substance Use Topics  . Alcohol use: Yes    Alcohol/week: 2.0 standard drinks    Types: 2 Cans of beer per week  . Drug use: No    Family History  Problem Relation Age of Onset  . Atrial fibrillation Mother   . Hyperlipidemia Father   . CAD Father   . Diabetes Father   . Heart disease Father 68  . Hyperlipidemia Brother   . Colon cancer Neg Hx   . Prostate cancer Neg Hx     Review of Systems: A 12-system review of systems was performed and was negative except as noted in the HPI.  --------------------------------------------------------------------------------------------------  Physical Exam: BP 128/90 (BP Location: Right Arm, Patient Position: Sitting, Cuff Size: Normal)   Pulse 83   Ht 6' (1.829 m)   Wt 222 lb 4 oz (100.8 kg)   SpO2 98%   BMI 30.14 kg/m   General:  NAD. HEENT: No  conjunctival pallor or scleral icterus. Facemask in place. Neck: Supple without lymphadenopathy, thyromegaly, JVD, or HJR. No carotid bruit. Lungs: Normal work of breathing. Clear to auscultation bilaterally without wheezes or crackles. Heart: Regular rate and rhythm without murmurs, rubs, or gallops. Non-displaced PMI. Abd: Bowel sounds present. Soft, NT/ND without hepatosplenomegaly Ext: No lower  extremity edema. Radial, PT, and DP pulses are 2+ bilaterally Skin: Warm and dry without rash. Neuro: CNIII-XII intact. Strength and fine-touch sensation intact in upper and lower extremities bilaterally. Psych: Normal mood and affect.  EKG:  Normal sinus rhythm without abnormality.  Lab Results  Component Value Date   WBC 5.5 02/26/2020   HGB 15.8 02/26/2020   HCT 46.0 02/26/2020   MCV 91.1 02/26/2020   PLT 246.0 02/26/2020    Lab Results  Component Value Date   NA 139 02/26/2020   K 4.5 02/26/2020   CL 103 02/26/2020   CO2 28 02/26/2020   BUN 15 02/26/2020   CREATININE 0.91 02/26/2020   GLUCOSE 86 04/05/2020   ALT 53 02/26/2020    Lab Results  Component Value Date   CHOL 129 04/05/2020   HDL 23.70 (L) 04/05/2020   LDLCALC 77 03/31/2019   LDLDIRECT 73.0 04/05/2020   TRIG 302.0 (H) 04/05/2020   CHOLHDL 5 04/05/2020     --------------------------------------------------------------------------------------------------  ASSESSMENT AND PLAN: Hypertriglyceridemia and cardiovascular risk assessment: Mr. Zumbro feels well without any signs or symptoms of significant CAD.  His EKG is also normal today.  His father was recently diagnosed with CAD requiring CABG, albeit at age 54.  Mr. Seedorf's cardiac risk factors are male gender, hypertension, and hypertriglyceridemia.  I encouraged him to continue to exercise regularly.  We will obtain a coronary calcium score for further risk stratification.  If he does not have any CAC, I think it would be reasonable to continue fenofibrate and to continue working on diet and exercise to help control his lipids, as his LDL is well-controlled.  If there is evidence of CAC, statin therapy +/- ischemia tested will need to be considered.  Hypertension: Blood pressure borderline today with DBP of 90.  We will defer medication changes today.  Follow-up: Return to clinic in 1 year.  Yvonne Kendall, MD 04/28/2020 2:28 PM

## 2020-04-28 ENCOUNTER — Encounter: Payer: Self-pay | Admitting: Internal Medicine

## 2020-05-06 ENCOUNTER — Ambulatory Visit
Admission: RE | Admit: 2020-05-06 | Discharge: 2020-05-06 | Disposition: A | Discharge status: Home / Self Care | Payer: BC Managed Care – PPO | Source: Ambulatory Visit | Attending: Internal Medicine | Admitting: Internal Medicine

## 2020-05-06 ENCOUNTER — Other Ambulatory Visit: Payer: Self-pay

## 2020-05-06 DIAGNOSIS — E781 Pure hyperglyceridemia: Secondary | ICD-10-CM | POA: Diagnosis present

## 2020-05-06 DIAGNOSIS — Z9189 Other specified personal risk factors, not elsewhere classified: Secondary | ICD-10-CM | POA: Diagnosis present

## 2020-05-09 ENCOUNTER — Telehealth: Payer: Self-pay | Admitting: Internal Medicine

## 2020-05-09 NOTE — Telephone Encounter (Signed)
I called and spoke with the patient regarding his CT calcium score results per Dr. Saunders Revel as stated below.  He is agreeable to a virtual visit with Dr. Saunders Revel on Friday 05/13/20.  Telehealth Consent sent via the patient's MyChart.

## 2020-05-09 NOTE — Telephone Encounter (Signed)
Attempted to call Mr. Mustard do discuss results of coronary calcium score, which was high, but did not receive an answer.  I will have our office reach out to Roberto Black to see if we can schedule a virtual visit to discuss results in detail, as well as needed for escalation of lipid therapy and possible ischemia testing.  Nelva Bush, MD [facility]

## 2020-05-13 ENCOUNTER — Encounter: Payer: Self-pay | Admitting: Internal Medicine

## 2020-05-13 ENCOUNTER — Telehealth (INDEPENDENT_AMBULATORY_CARE_PROVIDER_SITE_OTHER): Payer: BC Managed Care – PPO | Admitting: Internal Medicine

## 2020-05-13 ENCOUNTER — Other Ambulatory Visit: Payer: Self-pay

## 2020-05-13 VITALS — HR 80 | Ht 74.0 in | Wt 220.0 lb

## 2020-05-13 DIAGNOSIS — E781 Pure hyperglyceridemia: Secondary | ICD-10-CM

## 2020-05-13 DIAGNOSIS — I2510 Atherosclerotic heart disease of native coronary artery without angina pectoris: Secondary | ICD-10-CM | POA: Diagnosis not present

## 2020-05-13 MED ORDER — ATORVASTATIN CALCIUM 20 MG PO TABS: 20.0000 mg | ORAL_TABLET | Freq: Every day | ORAL | 1 refills | Status: DC

## 2020-05-13 MED ORDER — ASPIRIN EC 81 MG PO TBEC: 81.0000 mg | DELAYED_RELEASE_TABLET | Freq: Every day | ORAL | 3 refills | Status: AC

## 2020-05-13 NOTE — Progress Notes (Signed)
Virtual Visit via Telephone Note   This visit type was conducted due to national recommendations for restrictions regarding the COVID-19 Pandemic (e.g. social distancing) in an effort to limit this patient's exposure and mitigate transmission in our community.  Due to his co-morbid illnesses, this patient is at least at moderate risk for complications without adequate follow up.  This format is felt to be most appropriate for this patient at this time.  The patient did not have access to video technology/had technical difficulties with video requiring transitioning to audio format only (telephone).  All issues noted in this document were discussed and addressed.  No physical exam could be performed with this format.  Please refer to the patient's chart for his  consent to telehealth for [facility].    Date:  05/13/2020   ID:  Roberto Black, DOB [birth year], MRN [id number] The patient was identified using 2 identifiers.  Patient Location: Home Provider Location: Home Office  PCP:  Tonia Ghent, MD  Cardiologist:  Nelva Bush, MD  Electrophysiologist:  None   Evaluation Performed:  Follow-Up Visit  Chief Complaint:  Follow-up coronary calcium score  History of Present Illness:    Roberto Black is a 45 y.o. male with history of hypertriglyceridemia and family history of coronary artery disease.  We are speaking today to follow-up recent coronary calcium score CT, which showed high CAC score.  [name] continues to feel well and is exercising regularly.  He denies chest pain, shortness of breath, palpitations, lightheadedness, and edema.  The patient does not have symptoms concerning for COVID-19 infection (fever, chills, cough, or new shortness of breath).    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 Swallow whole.   atorvastatin (LIPITOR) 20 MG tablet Take 1 tablet (20 mg total) by mouth daily.   Cholecalciferol (VITAMIN D) 50 MCG (2000 UT) CAPS Take 1 capsule (2,000 Units total) by mouth daily.   desonide (DESOWEN) 0.05 % lotion Apply topically 2 (two) times daily. As needed.  Limit use as much as possible.   fenofibrate (TRICOR) 145 MG tablet Take 1 tablet (145 mg total) by mouth daily.   hydrochlorothiazide (HYDRODIURIL) 12.5 MG tablet Take 1 tablet (12.5 mg total) by mouth daily.   losartan (COZAAR) 50 MG tablet Take 1 tablet (50 mg total) by mouth daily.     Allergies:   Patient has no known allergies.   Social History   Tobacco Use   Smoking status: Never Smoker   Smokeless tobacco: Never Used  Vaping Use   Vaping Use: Never used  Substance Use Topics   Alcohol use: Yes    Alcohol/week: 2.0 standard drinks    Types: 2 Cans of beer per week   Drug use: No     Family Hx: The patient's family history includes Atrial fibrillation in his mother; CAD in his father; Diabetes in his father; Heart disease (age of onset: 39) in his father; Hyperlipidemia in his brother and father. There is no history of Colon cancer or Prostate cancer.  ROS:   Please see  the history of present illness.   All other systems reviewed and are negative.   Prior CV studies:   The following studies were reviewed today:  Coronary calcium score (05/06/2020): CAC score = 368 (high; 99th percentile for age/sex).  Incidental note made of hepatic steatosis.  Labs/Other Tests and Data Reviewed:    EKG:  No ECG reviewed.  Recent Labs: 02/26/2020: ALT 53; BUN 15; Creatinine, Ser 0.91; Hemoglobin 15.8; Platelets 246.0; Potassium 4.5; Sodium 139   Recent Lipid Panel Lab Results  Component Value Date/Time   CHOL 129 04/05/2020 08:15 AM    TRIG 302.0 (H) 04/05/2020 08:15 AM   HDL 23.70 (L) 04/05/2020 08:15 AM   CHOLHDL 5 04/05/2020 08:15 AM   LDLCALC 77 03/31/2019 09:22 AM   LDLDIRECT 73.0 04/05/2020 08:15 AM    Wt Readings from Last 3 Encounters:  05/13/20 220 lb (99.8 kg)  04/27/20 222 lb 4 oz (100.8 kg)  04/08/20 218 lb 4.8 oz (99 kg)     Risk Assessment/Calculations:      Objective:    Vital Signs:  Pulse 80    Ht 6\' 2"  (1.88 m)    Wt 220 lb (99.8 kg)    BMI 28.25 kg/m    VITAL SIGNS:  reviewed  ASSESSMENT & PLAN:    Coronary artery disease: [name] remains asymptomatic but was found to have a high coronary calcium score (99th percentile for age/gender).  I have recommended that we obtain an exercise tolerance test to objective assess for myocardial ischemia (including silent ischemia).  We have discussed the risk and benefits; [name] is willing to proceed.  We have also agreed to add aspirin 81 mg daily and atorvastatin 20 mg daily.  Hyperlipidemia: Recent lipid panel notable for continued moderate hypertriglyceridemia.  LDL was good at 72, though in the setting of high CAC score, I think it would be prudent to add statin therapy to further lower LDL and triglycerides.  We will start atorvastatin 20 mg daily with repeat lipid panel and LFT's in ~3 months.  Hepatic steatosis: Incidentally noted on recent chest CT.  [name] does not have a history of liver disease.  I encouraged him to minimize his alcohol consumption and to speak with Dr. Damita Dunnings about the need for further evaluation at his next visit.  COVID-19 Education: The signs and symptoms of COVID-19 were discussed with the patient and how to seek care for testing (follow up with PCP or arrange E-visit).  The importance of social distancing was discussed today.  Time:   Today, I have spent 13 minutes with the patient with telehealth technology discussing the above problems.     Medication Adjustments/Labs and Tests Ordered: Current  medicines are reviewed at length with the patient today.  Concerns regarding medicines are outlined above.   Tests Ordered: Orders Placed This Encounter  Procedures   Exercise Tolerance Test    Medication Changes: Meds ordered this encounter  Medications   aspirin EC 81 MG tablet    Sig: Take 1 tablet (81 mg total) by mouth daily. Swallow whole.    Dispense:  90 tablet    Refill:  3   atorvastatin (LIPITOR) 20 MG tablet    Sig: Take 1 tablet (20 mg total) by mouth daily.    Dispense:  90 tablet    Refill:  1    Follow Up:  In Person in 3 month(s)  Signed, Nelva Bush, MD  05/13/2020 10:54 AM    [facility] ° °

## 2020-05-13 NOTE — Patient Instructions (Signed)
Medication Instructions:  Your physician has recommended you make the following change in your medication:  1- START Aspirin 81 mg by mouth once a day. 2- START Atorvastatin 20 mg by mouth once a day.  *If you need a refill on your cardiac medications before your next appointment, please call your pharmacy*  Lab Work: COVID PRE- TEST: You will need a COVID TEST prior to the procedure:  LOCATION: [facility].  DATE/TIME:  The day before your treadmill stress test (anytime between 8 am and 1 pm)   If you have labs (blood work) drawn today and your tests are completely normal, you will receive your results only by: Marland Kitchen MyChart Message (if you have MyChart) OR . A paper copy in the mail If you have any lab test that is abnormal or we need to change your treatment, we will call you to review the results.  Testing/Procedures: Your physician has requested that you have an exercise tolerance test. For further information please visit [email or web address]. Please also follow instruction sheet, as given.   DO NOT drink or eat foods with caffeine for 24 hours before the test. (Chocolate, coffee, tea, decaf coffee/tea, or energy drinks)  DO NOT smoke for 4 hours before your test.  If you use an inhaler, bring it with you to the test.  Wear comfortable shoes for exercise and clothing.   Follow-Up: At [facility], you and your health needs are our priority.  As part of our continuing mission to provide you with exceptional heart care, we have created designated Provider Care Teams.  These Care Teams include your primary Cardiologist (physician) and Advanced Practice Providers (APPs -  Physician Assistants and Nurse Practitioners) who all work together to provide you with the care you need, when you need it.  We recommend signing up for the patient portal called "MyChart".  Sign up information is provided on this After Visit Summary.  MyChart is used  to connect with patients for Virtual Visits (Telemedicine).  Patients are able to view lab/test results, encounter notes, upcoming appointments, etc.  Non-urgent messages can be sent to your provider as well.   To learn more about what you can do with MyChart, go to [email or web address].    Your next appointment:   3 month(s) - early appointment time so you can be fasting for cholesterol lab work that morning.   The format for your next appointment:   In Person  Provider:   You may see Nelva Bush, MD or one of the following Advanced Practice Providers on your designated Care Team:    Murray Hodgkins, NP  Christell Faith, PA-C  Marrianne Mood, PA-C  Cadence Kathlen Mody, [place]  Laurann Montana, NP    Exercise Stress Test An exercise stress test is a test to check how your heart works during exercise. You will need to walk on a treadmill or ride an exercise bike for this test. An electrocardiogram (ECG) will record your heartbeat when you are at rest and when you are exercising. You may have an ultrasound or nuclear test after the exercise test. The test is done to check for coronary artery disease (CAD). It is also done to:  See how well you can exercise.  Watch for high blood pressure during exercise.  Test how well you can exercise after treatment.  Check the blood flow to your arms and legs. If your test result is not normal, more testing may be needed. What happens before the  procedure?  Follow instructions from your doctor about what you cannot eat or drink. ? Do not have any drinks or foods that have caffeine in them for 24 hours before the test, or as told by your doctor. This includes coffee, tea (even decaf tea), sodas, chocolate, and cocoa.  Ask your doctor about changing or stopping your normal medicines. This is important if you: ? Take diabetes medicines. ? Take beta-blocker medicines. ? Wear a nitroglycerin patch.  If you use an inhaler, bring it with you to the  test.  Do not put lotions, powders, creams, or oils on your chest before the test.  Wear comfortable shoes and clothing.  Do not use any products that have nicotine or tobacco in them, such as cigarettes and e-cigarettes. Stop using them at least 4 hours before the test. If you need help quitting, ask your doctor. What happens during the procedure?  Patches (electrodes) will be put on your chest.  Wires will be connected to the patches. The wires will send signals to a machine to record your heartbeat.  Your heart rate will be watched while you are resting and while you are exercising. Your blood pressure will also be watched during the test.  You will walk on a treadmill or use an exercise bike. If you cannot use these, you may be asked to turn a crank with your hands.  The activity will get harder and will raise your heart rate.  You may be asked to breathe into a tube a few times during the test. This measures the gases that you breathe out.  You will be asked how you are feeling throughout the test.  You will exercise until your heart reaches a target heart rate. You will stop early if: ? You feel dizzy. ? You have chest pain. ? You are out of breath. ? Your blood pressure is too high or too low. ? You have an irregular heartbeat. ? You have pain or aching in your arms or legs. The procedure may vary among doctors and hospitals.   What happens after the procedure?  Your blood pressure, heart rate, breathing rate, and blood oxygen level will be watched after the test.  You may return to your normal diet and activities as told by your doctor.  It is up to you to get the results of your test. Ask your doctor, or the department that is doing the test, when your results will be ready. Summary  An exercise stress test is a test to check how your heart works during exercise.  This test is done to check for coronary artery disease.  Your heart rate will be watched while you  are resting and while you are exercising.  Follow instructions from your doctor about what you cannot eat or drink before the test. This information is not intended to replace advice given to you by your health care provider. Make sure you discuss any questions you have with your health care provider. Document Revised: 12/11/2019 Document Reviewed: 12/11/2019 Elsevier Patient Education  [place].

## 2020-05-18 ENCOUNTER — Other Ambulatory Visit: Payer: Self-pay | Admitting: Family Medicine

## 2020-06-03 ENCOUNTER — Other Ambulatory Visit: Payer: Self-pay

## 2020-06-03 ENCOUNTER — Other Ambulatory Visit
Admission: RE | Admit: 2020-06-03 | Discharge: 2020-06-03 | Disposition: A | Discharge status: Home / Self Care | Payer: BC Managed Care – PPO | Source: Ambulatory Visit | Attending: Internal Medicine | Admitting: Internal Medicine

## 2020-06-03 DIAGNOSIS — Z20822 Contact with and (suspected) exposure to covid-19: Secondary | ICD-10-CM | POA: Insufficient documentation

## 2020-06-03 DIAGNOSIS — Z01812 Encounter for preprocedural laboratory examination: Secondary | ICD-10-CM | POA: Insufficient documentation

## 2020-06-03 LAB — SARS CORONAVIRUS 2 (TAT 6-24 HRS): SARS Coronavirus 2: NEGATIVE

## 2020-06-07 ENCOUNTER — Telehealth: Payer: Self-pay | Admitting: *Deleted

## 2020-06-07 NOTE — Telephone Encounter (Signed)
Patient has treadmill stress test tomorrow at 9 am. Called him and he verbalized understanding of date, time and the below instructions.    DO NOT drink or eat foods with caffeine for 24 hours before the test. (Chocolate, coffee, tea, decaf coffee/tea, or energy drinks)  DO NOT smoke for 4 hours before your test.  If you use an inhaler, bring it with you to the test.  Wear comfortable shoes and clothing. Women do not wear dresses.

## 2020-06-08 ENCOUNTER — Other Ambulatory Visit: Payer: Self-pay

## 2020-06-08 ENCOUNTER — Ambulatory Visit (INDEPENDENT_AMBULATORY_CARE_PROVIDER_SITE_OTHER): Payer: BC Managed Care – PPO

## 2020-06-08 DIAGNOSIS — I2510 Atherosclerotic heart disease of native coronary artery without angina pectoris: Secondary | ICD-10-CM | POA: Diagnosis not present

## 2020-06-08 DIAGNOSIS — E781 Pure hyperglyceridemia: Secondary | ICD-10-CM | POA: Diagnosis not present

## 2020-06-08 LAB — EXERCISE TOLERANCE TEST
Estimated workload: 12.8 METS
Exercise duration (min): 10 min
Exercise duration (sec): 41 s
MPHR: 174 {beats}/min
Peak HR: 162 {beats}/min
Percent HR: 93 %
RPE: 16
Rest HR: 90 {beats}/min

## 2020-07-29 ENCOUNTER — Other Ambulatory Visit: Payer: Self-pay | Admitting: Family Medicine

## 2020-08-13 ENCOUNTER — Other Ambulatory Visit: Payer: Self-pay | Admitting: Family Medicine

## 2020-11-07 ENCOUNTER — Other Ambulatory Visit: Payer: Self-pay | Admitting: Internal Medicine

## 2020-11-08 ENCOUNTER — Ambulatory Visit: Payer: BC Managed Care – PPO | Admitting: Family Medicine

## 2020-11-08 NOTE — Telephone Encounter (Signed)
Please schedule overdue 3 month F/U appointment. Thank you! 

## 2020-11-10 ENCOUNTER — Other Ambulatory Visit: Payer: Self-pay

## 2020-11-10 ENCOUNTER — Ambulatory Visit: Payer: BC Managed Care – PPO | Admitting: Family Medicine

## 2020-11-10 ENCOUNTER — Encounter: Payer: Self-pay | Admitting: Family Medicine

## 2020-11-10 VITALS — BP 120/78 | HR 84 | Temp 97.4°F | Ht 74.0 in | Wt 218.0 lb

## 2020-11-10 DIAGNOSIS — E781 Pure hyperglyceridemia: Secondary | ICD-10-CM | POA: Diagnosis not present

## 2020-11-10 DIAGNOSIS — Z659 Problem related to unspecified psychosocial circumstances: Secondary | ICD-10-CM | POA: Diagnosis not present

## 2020-11-10 MED ORDER — HYDROCHLOROTHIAZIDE 25 MG PO TABS: 25.0000 mg | ORAL_TABLET | Freq: Every day | ORAL | 3 refills | Status: DC

## 2020-11-10 MED ORDER — CITALOPRAM HYDROBROMIDE 20 MG PO TABS: 20.0000 mg | ORAL_TABLET | Freq: Every day | ORAL | 1 refills | Status: DC

## 2020-11-10 NOTE — Patient Instructions (Signed)
Schedule fasting labs whenever possible.  Start celexa and update me in a few weeks.  Take care.  Glad to see you. Thanks for your effort.

## 2020-11-10 NOTE — Progress Notes (Signed)
This visit occurred during the SARS-CoV-2 public health emergency.  Safety protocols were in place, including screening questions prior to the visit, additional usage of staff PPE, and extensive cleaning of exam room while observing appropriate contact time as indicated for disinfecting solutions.  Intentional weight loss with diet and exercise.    He had been on HCTZ 12.5mg  tabs, 2 a day.  D/w pt about change to 1 of the 25mg  tabs.  Rx printed for patient.    Stressors d/w pt.  58 y/o son had concerns, son's mood was worse and had pediatric f/u.  Son is driving now, was in [place] about 1 month ago.  Per patient, sig change in his son from 68 to 91 y/o.  Job stress d/w pt.  All of this is affecting the patient and the family dynamic.  They had a family meeting.    Pt was tearful episodically and irritable.  No SI/HI.    H/o calcium scoring.  Now on lipitor per cards.   Using medications without problems: yes Muscle aches: no Diet compliance: yes Exercise: yes See avs.    Meds, vitals, and allergies reviewed.   ROS: Per HPI unless specifically indicated in ROS section   GEN: nad, alert and oriented HEENT: ncat NECK: supple w/o LA CV: rrr. PULM: ctab, no inc wob ABD: soft, +bs EXT: no edema SKIN: Well-perfused Speech judgment and affect normal.  30 minutes were devoted to patient care in this encounter (this includes time spent reviewing the patient's file/history, interviewing and examining the patient, counseling/reviewing plan with patient).

## 2020-11-13 DIAGNOSIS — Z659 Problem related to unspecified psychosocial circumstances: Secondary | ICD-10-CM | POA: Insufficient documentation

## 2020-11-13 NOTE — Assessment & Plan Note (Signed)
Previously had relief from Celexa.  Discussed options.  Routine cautions given to patient, regarding use and timeline and affect.  Reasonable to start celexa and update me in a few weeks.  He agrees with plan.  Okay for outpatient follow-up.

## 2020-11-13 NOTE — Assessment & Plan Note (Signed)
Now on Lipitor and he will schedule fasting labs when possible.

## 2020-11-15 ENCOUNTER — Other Ambulatory Visit (INDEPENDENT_AMBULATORY_CARE_PROVIDER_SITE_OTHER): Payer: BC Managed Care – PPO

## 2020-11-15 ENCOUNTER — Other Ambulatory Visit: Payer: Self-pay

## 2020-11-15 DIAGNOSIS — E781 Pure hyperglyceridemia: Secondary | ICD-10-CM | POA: Diagnosis not present

## 2020-11-15 LAB — COMPREHENSIVE METABOLIC PANEL
ALT: 41 U/L (ref 0–53)
AST: 19 U/L (ref 0–37)
Albumin: 4.6 g/dL (ref 3.5–5.2)
Alkaline Phosphatase: 46 U/L (ref 39–117)
BUN: 22 mg/dL (ref 6–23)
CO2: 28 mEq/L (ref 19–32)
Calcium: 9.4 mg/dL (ref 8.4–10.5)
Chloride: 102 mEq/L (ref 96–112)
Creatinine, Ser: 0.99 mg/dL (ref 0.40–1.50)
GFR: 91.29 mL/min (ref >60.00)
Glucose, Bld: 95 mg/dL (ref 70–99)
Potassium: 4 mEq/L (ref 3.5–5.1)
Sodium: 137 mEq/L (ref 135–145)
Total Bilirubin: 0.4 mg/dL (ref 0.2–1.2)
Total Protein: 7.3 g/dL (ref 6.0–8.3)

## 2020-11-15 LAB — LDL CHOLESTEROL, DIRECT: Direct LDL: 53 mg/dL

## 2020-11-15 LAB — LIPID PANEL
Cholesterol: 99 mg/dL (ref 0–200)
HDL: 23.3 mg/dL — ABNORMAL LOW (ref >39.00)
NonHDL: 75.76
Total CHOL/HDL Ratio: 4
Triglycerides: 203 mg/dL — ABNORMAL HIGH (ref 0.0–149.0)
VLDL: 40.6 mg/dL — ABNORMAL HIGH (ref 0.0–40.0)

## 2020-12-02 ENCOUNTER — Other Ambulatory Visit: Payer: Self-pay | Admitting: Family Medicine

## 2020-12-05 ENCOUNTER — Other Ambulatory Visit: Payer: Self-pay | Admitting: Internal Medicine

## 2020-12-19 ENCOUNTER — Other Ambulatory Visit: Payer: Self-pay | Admitting: Internal Medicine

## 2021-01-16 ENCOUNTER — Other Ambulatory Visit: Payer: Self-pay | Admitting: Internal Medicine

## 2021-02-01 ENCOUNTER — Ambulatory Visit: Payer: BC Managed Care – PPO | Admitting: Internal Medicine

## 2021-02-01 ENCOUNTER — Telehealth: Payer: Self-pay | Admitting: *Deleted

## 2021-02-01 ENCOUNTER — Other Ambulatory Visit: Payer: Self-pay

## 2021-02-01 ENCOUNTER — Encounter: Payer: Self-pay | Admitting: Internal Medicine

## 2021-02-01 VITALS — BP 130/86 | HR 75 | Ht 72.0 in | Wt 220.0 lb

## 2021-02-01 DIAGNOSIS — I2510 Atherosclerotic heart disease of native coronary artery without angina pectoris: Secondary | ICD-10-CM

## 2021-02-01 DIAGNOSIS — E781 Pure hyperglyceridemia: Secondary | ICD-10-CM | POA: Diagnosis not present

## 2021-02-01 DIAGNOSIS — I10 Essential (primary) hypertension: Secondary | ICD-10-CM

## 2021-02-01 MED ORDER — ICOSAPENT ETHYL 1 G PO CAPS: 2.0000 g | ORAL_CAPSULE | Freq: Two times a day (BID) | ORAL | 5 refills | Status: DC

## 2021-02-01 MED ORDER — ATORVASTATIN CALCIUM 20 MG PO TABS: ORAL_TABLET | ORAL | 0 refills | Status: DC

## 2021-02-01 NOTE — Telephone Encounter (Signed)
Pt requiring PA for Vascepa 1 GM capsule. PA has been submitted via Covermymeds. Awaiting approval.  Key: [id number]

## 2021-02-01 NOTE — Telephone Encounter (Signed)
Your information has been submitted to [employer]. To check for an updated outcome later, reopen this PA request from your dashboard. If [employer] has not responded to your request within 24 hours, contact [employer] at [phone]. If you think there may be a problem with your PA request, use our live chat feature at the bottom right.    

## 2021-02-01 NOTE — Patient Instructions (Signed)
Medication Instructions:   Your physician has recommended you make the following change in your medication:   START Vascepa 2 gram TWICE daily   *If you need a refill on your cardiac medications before your next appointment, please call your pharmacy*   Lab Work:  Your physician recommends that you return for FASTING lab work in: 3 MONTHS (Lipid panel / ALT)   Testing/Procedures:  None ordered   Follow-Up: At [facility], you and your health needs are our priority.  As part of our continuing mission to provide you with exceptional heart care, we have created designated Provider Care Teams.  These Care Teams include your primary Cardiologist (physician) and Advanced Practice Providers (APPs -  Physician Assistants and Nurse Practitioners) who all work together to provide you with the care you need, when you need it.  We recommend signing up for the patient portal called "MyChart".  Sign up information is provided on this After Visit Summary.  MyChart is used to connect with patients for Virtual Visits (Telemedicine).  Patients are able to view lab/test results, encounter notes, upcoming appointments, etc.  Non-urgent messages can be sent to your provider as well.   To learn more about what you can do with MyChart, go to [email or web address].    Your next appointment:   6 month(s)  The format for your next appointment:   In Person  Provider:   You may see Nelva Bush, MD or one of the following Advanced Practice Providers on your designated Care Team:   Murray Hodgkins, NP Christell Faith, PA-C Marrianne Mood, PA-C Cadence Violet, [place]

## 2021-02-01 NOTE — Progress Notes (Signed)
Follow-up Outpatient Visit Date: 02/01/2021  Primary Care Provider: Tonia Ghent, MD [place] [address]  Chief Complaint: Follow-up coronary artery calcification and hypertriglyceridemia  HPI:  Roberto Black is a 45 y.o. male with history of hypertriglyceridemia and family history of coronary artery disease, who presents for follow-up of coronary artery calcification.  We last spoke via virtual visit in January, at which time Roberto Black was feeling well.  We agreed to obtain an exercise tolerance test to exclude silent ischemia.  This was low risk with good exercise capacity and no ischemic changes.  Today, Roberto Black reports that he is doing quite well.  He denies chest pain, shortness of breath, palpitations, and edema.  He is exercising regularly at the gym without difficulty.  He notes his blood pressures are typically little better at home.  He is tolerating his medications including atorvastatin and fenofibrate, well.  He has taken fish oil in the past but did not like this due to GI side effects.  He reports that his 47 year old brother recently had 2 stents placed.  --------------------------------------------------------------------------------------------------  Cardiovascular History & Procedures: Cardiovascular Problems: Hypertriglyceridemia Coronary artery calcification   Risk Factors: Coronary artery calcification, hypertension, family history, and male gender   Cath/PCI: None   CV Surgery: None   EP Procedures and Devices: None   Non-Invasive Evaluation(s): Exercise tolerance test (06/08/2020): Low risk study without evidence of ischemia.  Good exercise capacity with hypertensive blood pressure response. Coronary calcium score (05/06/2020): Coronary calcium score 368 (high), 99th percentile for age and sex matched control.  Incidental note of hepatic steatosis made.  Recent CV Pertinent Labs: Lab Results  Component Value Date   CHOL  99 11/15/2020   HDL 23.30 (L) 11/15/2020   LDLCALC 77 03/31/2019   LDLDIRECT 53.0 11/15/2020   TRIG 203.0 (H) 11/15/2020   CHOLHDL 4 11/15/2020   INR 1.1 03/14/2007   K 4.0 11/15/2020   BUN 22 11/15/2020   CREATININE 0.99 11/15/2020    Past medical and surgical history were reviewed and updated in EPIC.  Current Meds  Medication Sig   aspirin EC 81 MG tablet Take 1 tablet (81 mg total) by mouth daily. Swallow whole.   Cholecalciferol (VITAMIN D) 50 MCG (2000 UT) CAPS Take 1 capsule (2,000 Units total) by mouth daily.   citalopram (CELEXA) 20 MG tablet Take 1 tablet (20 mg total) by mouth daily.   desonide (DESOWEN) 0.05 % lotion APPLY TOPICALLY 2 (TWO) TIMES DAILY. AS NEEDED. LIMIT USE AS MUCH AS POSSIBLE.   fenofibrate (TRICOR) 145 MG tablet Take 1 tablet (145 mg total) by mouth daily.   hydrochlorothiazide (HYDRODIURIL) 25 MG tablet Take 1 tablet (25 mg total) by mouth daily.   icosapent Ethyl (VASCEPA) 1 g capsule Take 2 capsules (2 g total) by mouth 2 (two) times daily.   losartan (COZAAR) 50 MG tablet Take 1 tablet (50 mg total) by mouth daily.   [DISCONTINUED] atorvastatin (LIPITOR) 20 MG tablet TAKE 1 TABLET (20 MG TOTAL) BY MOUTH DAILY. PLEASE CALL TO SCHEDULE OFFICE VISIT FOR FURTHER REFILLS    Allergies: Patient has no known allergies.  Social History   Tobacco Use   Smoking status: Never   Smokeless tobacco: Never  Vaping Use   Vaping Use: Never used  Substance Use Topics   Alcohol use: Yes    Alcohol/week: 2.0 standard drinks    Types: 2 Cans of beer per week    Comment: 2 beers per week  Drug use: No    Family History  Problem Relation Age of Onset   Atrial fibrillation Mother    Hyperlipidemia Father    CAD Father    Diabetes Father    Heart disease Father 69   CAD Brother 60       PCI x 2   Hyperlipidemia Brother    Colon cancer Neg Hx    Prostate cancer Neg Hx     Review of Systems: A 12-system review of systems was performed and was  negative except as noted in the HPI.  --------------------------------------------------------------------------------------------------  Physical Exam: BP 130/86 (BP Location: Left Arm, Patient Position: Sitting, Cuff Size: Large)   Pulse 75   Ht 6' (1.829 m)   Wt 220 lb (99.8 kg)   SpO2 98%   BMI 29.84 kg/m   General:  NAD. Neck: No JVD or HJR. Lungs: Clear to auscultation bilaterally without wheezes or crackles. Heart: Regular rate and rhythm without murmurs, rubs, or gallops. Abdomen: Soft, nontender, nondistended. Extremities: No lower extremity edema.  EKG: Normal sinus rhythm without abnormality.  Lab Results  Component Value Date   WBC 5.5 02/26/2020   HGB 15.8 02/26/2020   HCT 46.0 02/26/2020   MCV 91.1 02/26/2020   PLT 246.0 02/26/2020    Lab Results  Component Value Date   NA 137 11/15/2020   K 4.0 11/15/2020   CL 102 11/15/2020   CO2 28 11/15/2020   BUN 22 11/15/2020   CREATININE 0.99 11/15/2020   GLUCOSE 95 11/15/2020   ALT 41 11/15/2020    Lab Results  Component Value Date   CHOL 99 11/15/2020   HDL 23.30 (L) 11/15/2020   LDLCALC 77 03/31/2019   LDLDIRECT 53.0 11/15/2020   TRIG 203.0 (H) 11/15/2020   CHOLHDL 4 11/15/2020    --------------------------------------------------------------------------------------------------  ASSESSMENT AND PLAN: Coronary artery calcification and hypertriglyceridemia: Roberto Black remains asymptomatic.  He has multiple risk factors for significant ASCVD including high coronary artery calcium score, hypertriglyceridemia, and family history, with his brother recently having 2 stents placed at age 62.  Fortunately, ETT earlier this year was reassuring with good exercise capacity and no ischemic changes.  We will attempt to escalate pharmacotherapy in the form of adding Vascepa 2 g twice daily, as long as this is not cost prohibitive.  We will continue current doses of fenofibrate and atorvastatin, with plans for repeat  lipid panel and ALT in 3 months.  I encouraged Mr. Franek to remain on aspirin and continue his regular exercise regimen.  Hypertension: Blood pressure upper normal today, typically better at home.  Continue current medications.  Follow-up: In person/virtual visit in 6 months.  Nelva Bush, MD 02/01/2021 9:46 AM

## 2021-02-02 NOTE — Telephone Encounter (Signed)
Per fax received from [place] has been approved from 02/01/21 through 02/02/24 as long as patient remains covered by the [facility] and there are no changes made to plan benefits.

## 2021-03-05 ENCOUNTER — Other Ambulatory Visit: Payer: Self-pay | Admitting: Family Medicine

## 2021-03-05 DIAGNOSIS — E781 Pure hyperglyceridemia: Secondary | ICD-10-CM

## 2021-04-10 ENCOUNTER — Other Ambulatory Visit: Payer: Self-pay | Admitting: Family Medicine

## 2021-04-10 ENCOUNTER — Encounter: Payer: BC Managed Care – PPO | Admitting: Family Medicine

## 2021-04-18 ENCOUNTER — Telehealth: Payer: Self-pay | Admitting: Internal Medicine

## 2021-04-18 NOTE — Telephone Encounter (Signed)
Pt c/o medication issue:  1. Name of Medication: Icosapent   2. How are you currently taking this medication (dosage and times per day)? 2 capsules twice a day  3. Are you having a reaction (difficulty breathing--STAT)? Heartburn, bad taste  4. What is your medication issue? Pharmacy switched from [facility] which was better

## 2021-04-18 NOTE — Telephone Encounter (Signed)
Spoke with the pt.  Pt was given Vascepa brand for first 30 days from pharmacy and pt tolerated very well.  Pt states on his first refill he was given the generic icosapent ethyl and he is unable to tolerate d/t heartburn/reflux and bad taste after taking.  Pt was approved by insurance to cover brand name Vascepa per cover my meds.  [facility] and gave verbal to only dispense as written for Vascepa 1 gm 2 capsules BID #120 x 3.  [facility] tech confirmed that pt will only receive brand name on refills.  Called and made pt aware. Pt will try to return sealed unopened Rx that he just picked up. If unable to return I did offer to give samples to cover what he was unable to take this month until he is allowed to pick up refill.  Pt appreciative of help and has no further questions at this time.

## 2021-05-02 ENCOUNTER — Ambulatory Visit (INDEPENDENT_AMBULATORY_CARE_PROVIDER_SITE_OTHER): Payer: BC Managed Care – PPO | Admitting: Family Medicine

## 2021-05-02 ENCOUNTER — Other Ambulatory Visit: Payer: Self-pay | Admitting: Family Medicine

## 2021-05-02 ENCOUNTER — Encounter: Payer: Self-pay | Admitting: Family Medicine

## 2021-05-02 ENCOUNTER — Other Ambulatory Visit: Payer: Self-pay

## 2021-05-02 ENCOUNTER — Other Ambulatory Visit (INDEPENDENT_AMBULATORY_CARE_PROVIDER_SITE_OTHER): Payer: BC Managed Care – PPO

## 2021-05-02 VITALS — BP 116/72 | HR 91 | Temp 98.0°F | Ht 72.0 in | Wt 228.0 lb

## 2021-05-02 DIAGNOSIS — Z7189 Other specified counseling: Secondary | ICD-10-CM

## 2021-05-02 DIAGNOSIS — Z0000 Encounter for general adult medical examination without abnormal findings: Secondary | ICD-10-CM

## 2021-05-02 DIAGNOSIS — I10 Essential (primary) hypertension: Secondary | ICD-10-CM

## 2021-05-02 DIAGNOSIS — E781 Pure hyperglyceridemia: Secondary | ICD-10-CM

## 2021-05-02 LAB — LIPID PANEL
Cholesterol: 100 mg/dL (ref 0–200)
HDL: 23.2 mg/dL — ABNORMAL LOW (ref >39.00)
NonHDL: 77.24
Total CHOL/HDL Ratio: 4
Triglycerides: 209 mg/dL — ABNORMAL HIGH (ref 0.0–149.0)
VLDL: 41.8 mg/dL — ABNORMAL HIGH (ref 0.0–40.0)

## 2021-05-02 LAB — COMPREHENSIVE METABOLIC PANEL
ALT: 88 U/L — ABNORMAL HIGH (ref 0–53)
AST: 40 U/L — ABNORMAL HIGH (ref 0–37)
Albumin: 4.5 g/dL (ref 3.5–5.2)
Alkaline Phosphatase: 41 U/L (ref 39–117)
BUN: 14 mg/dL (ref 6–23)
CO2: 27 mEq/L (ref 19–32)
Calcium: 10.1 mg/dL (ref 8.4–10.5)
Chloride: 99 mEq/L (ref 96–112)
Creatinine, Ser: 0.98 mg/dL (ref 0.40–1.50)
GFR: 92.11 mL/min (ref >60.00)
Glucose, Bld: 92 mg/dL (ref 70–99)
Potassium: 4.2 mEq/L (ref 3.5–5.1)
Sodium: 135 mEq/L (ref 135–145)
Total Bilirubin: 0.4 mg/dL (ref 0.2–1.2)
Total Protein: 7.2 g/dL (ref 6.0–8.3)

## 2021-05-02 LAB — LDL CHOLESTEROL, DIRECT: Direct LDL: 47 mg/dL

## 2021-05-02 NOTE — Progress Notes (Signed)
This visit occurred during the SARS-CoV-2 public health emergency.  Safety protocols were in place, including screening questions prior to the visit, additional usage of staff PPE, and extensive cleaning of exam room while observing appropriate contact time as indicated for disinfecting solutions.  CPE- See plan.  Routine anticipatory guidance given to patient.  See health maintenance.  The possibility exists that previously documented standard health maintenance information may have been brought forward from a previous encounter into this note.  If needed, that same information has been updated to reflect the current situation based on today's encounter.    Tetanus 2012 Flu 2022 PNA and shingles not due.   covid vaccine 2021.   Not due for colon or prostate cancer screening. Living will d/w pt.  Wife designated if patient were incapacitated. Diet and exercise.  He's working on both.  [facility]  HCV and HIV testing would have been done at that point.     He has been going to the gym, d/w pt.   He is still flying with [facility] transport.  D/w pt.    His bother had stent placed, d/w pt, ie FH CAD.  He had CAD based on prv scoring.  Discussed his history and his current regimen with Lipitor fenofibrate and Vascepa.  Elevated Cholesterol: Using medications without problems: yes Muscle aches: no Diet compliance:yes Exercise:yes  Hypertension:    Using medication without problems or lightheadedness: yes Chest pain with exertion:no Edema:no Short of breath:no  LFT elevation.  No FCNAVD.  Minimal etoh.  No tylenol.  No RUQ pain.  No jaundice.  No high risk behavior for LFT elevation.  He had prev fatty deposits on prev imaging-this could contribute to his current LFT elevation.  I want to discuss with cardiology.  Discussed with patient.  PMH and SH reviewed  Meds, vitals, and allergies reviewed.   ROS: Per HPI.  Unless specifically indicated otherwise in HPI, the  patient denies:  General: fever. Eyes: acute vision changes ENT: sore throat Cardiovascular: chest pain Respiratory: SOB GI: vomiting GU: dysuria Musculoskeletal: acute back pain Derm: acute rash Neuro: acute motor dysfunction Psych: worsening mood Endocrine: polydipsia Heme: bleeding Allergy: hayfever  GEN: nad, alert and oriented HEENT: ncat NECK: supple w/o LA CV: rrr. PULM: ctab, no inc wob ABD: soft, +bs, nontender to palpation. EXT: no edema SKIN: no acute rash no jaundice.,

## 2021-05-02 NOTE — Patient Instructions (Signed)
Don't change your meds for now.  Let me talk to Dr. Saunders Revel and we'll be in touch.  Take care.  Glad to see you.

## 2021-05-03 NOTE — Assessment & Plan Note (Signed)
Tetanus 2012 Flu 2022 PNA and shingles not due.   covid vaccine 2021.   Not due for colon or prostate cancer screening. Living will d/w pt.  Wife designated if patient were incapacitated. Diet and exercise.  He's working on both.  [facility]  HCV and HIV testing would have been done at that point.

## 2021-05-03 NOTE — Assessment & Plan Note (Signed)
Living will d/w pt.  Wife designated if patient were incapacitated.   ?

## 2021-05-03 NOTE — Assessment & Plan Note (Signed)
I want to discuss his situation, especially with his current LFT elevation, with cardiology.  Benign abdominal exam.  No jaundice.  Abdomen nontender.  Still okay for outpatient follow-up.  He does have a history of fatty liver noted on previous imaging.  That was previously incidentally seen.  No change in medications at this point.

## 2021-05-03 NOTE — Assessment & Plan Note (Signed)
Continue work on diet and exercise.  Continue hydrochlorothiazide.

## 2021-05-04 ENCOUNTER — Telehealth: Payer: Self-pay | Admitting: Family Medicine

## 2021-05-04 ENCOUNTER — Encounter: Payer: Self-pay | Admitting: *Deleted

## 2021-05-04 DIAGNOSIS — R7989 Other specified abnormal findings of blood chemistry: Secondary | ICD-10-CM

## 2021-05-04 NOTE — Telephone Encounter (Signed)
Spoke with patient about message below. He is fine to get [place] and repeat labs done. Lab scheduled for 07/18/21 at 8:10 am.

## 2021-05-04 NOTE — Telephone Encounter (Signed)
Please call pt.  I checked with Dr. Saunders Revel.  Reasonable to get ultrasound (ordered), have him work on diet and exercise in the meantime.  Assuming u/s doesn't show any new finding, then reasonable to recheck his LFTs in about 2 months.  I put in the f/u orders.  Please schedule lab visit in about 2 months.  Thanks .

## 2021-05-12 ENCOUNTER — Other Ambulatory Visit: Payer: Self-pay | Admitting: Internal Medicine

## 2021-05-14 ENCOUNTER — Other Ambulatory Visit: Payer: Self-pay | Admitting: Internal Medicine

## 2021-05-23 ENCOUNTER — Ambulatory Visit
Admission: RE | Admit: 2021-05-23 | Discharge: 2021-05-23 | Disposition: A | Discharge status: Home / Self Care | Payer: BC Managed Care – PPO | Source: Ambulatory Visit | Attending: Family Medicine | Admitting: Family Medicine

## 2021-05-23 ENCOUNTER — Other Ambulatory Visit: Payer: Self-pay

## 2021-05-23 DIAGNOSIS — R7989 Other specified abnormal findings of blood chemistry: Secondary | ICD-10-CM | POA: Insufficient documentation

## 2021-05-24 ENCOUNTER — Other Ambulatory Visit: Payer: Self-pay | Admitting: Family Medicine

## 2021-05-24 DIAGNOSIS — R7989 Other specified abnormal findings of blood chemistry: Secondary | ICD-10-CM

## 2021-07-12 ENCOUNTER — Other Ambulatory Visit: Payer: Self-pay | Admitting: Family Medicine

## 2021-07-18 ENCOUNTER — Other Ambulatory Visit (INDEPENDENT_AMBULATORY_CARE_PROVIDER_SITE_OTHER): Payer: BC Managed Care – PPO

## 2021-07-18 ENCOUNTER — Other Ambulatory Visit: Payer: Self-pay

## 2021-07-18 DIAGNOSIS — R7989 Other specified abnormal findings of blood chemistry: Secondary | ICD-10-CM | POA: Diagnosis not present

## 2021-07-18 LAB — HEPATIC FUNCTION PANEL
ALT: 64 U/L — ABNORMAL HIGH (ref 0–53)
AST: 33 U/L (ref 0–37)
Albumin: 4.8 g/dL (ref 3.5–5.2)
Alkaline Phosphatase: 36 U/L — ABNORMAL LOW (ref 39–117)
Bilirubin, Direct: 0.1 mg/dL (ref 0.0–0.3)
Total Bilirubin: 0.6 mg/dL (ref 0.2–1.2)
Total Protein: 7.4 g/dL (ref 6.0–8.3)

## 2021-07-23 ENCOUNTER — Other Ambulatory Visit: Payer: Self-pay | Admitting: Family Medicine

## 2021-07-23 DIAGNOSIS — R7989 Other specified abnormal findings of blood chemistry: Secondary | ICD-10-CM

## 2021-08-08 ENCOUNTER — Other Ambulatory Visit: Payer: Self-pay | Admitting: Internal Medicine

## 2021-08-30 ENCOUNTER — Other Ambulatory Visit: Payer: Self-pay | Admitting: Internal Medicine

## 2021-09-01 ENCOUNTER — Encounter: Payer: Self-pay | Admitting: Internal Medicine

## 2021-09-01 ENCOUNTER — Ambulatory Visit: Payer: BC Managed Care – PPO | Admitting: Internal Medicine

## 2021-09-01 VITALS — BP 128/82 | HR 87 | Ht 72.0 in | Wt 227.0 lb

## 2021-09-01 DIAGNOSIS — I2584 Coronary atherosclerosis due to calcified coronary lesion: Secondary | ICD-10-CM | POA: Diagnosis not present

## 2021-09-01 DIAGNOSIS — I2510 Atherosclerotic heart disease of native coronary artery without angina pectoris: Secondary | ICD-10-CM

## 2021-09-01 DIAGNOSIS — Z9189 Other specified personal risk factors, not elsewhere classified: Secondary | ICD-10-CM | POA: Diagnosis not present

## 2021-09-01 DIAGNOSIS — I10 Essential (primary) hypertension: Secondary | ICD-10-CM | POA: Diagnosis not present

## 2021-09-01 DIAGNOSIS — E781 Pure hyperglyceridemia: Secondary | ICD-10-CM

## 2021-09-01 NOTE — Progress Notes (Signed)
? ?Follow-up Outpatient Visit ?Date: 09/01/2021 ? ?Primary Care Provider: ?Tonia Ghent, MD ?[address] ?[place] [address] ? ?Chief Complaint: Follow-up coronary artery calcification and hypertriglyceridemia ? ?HPI:  Roberto Black is a 46 y.o. male with history of hypertriglyceridemia and family history of coronary artery disease, who presents for follow-up of coronary artery calcification.  I last saw him in 01/2021, at which time he was feeling well without any symptoms of coronary insufficiency.  Due to persistent hypertriglyceridemia, we agreed to start Vascepa 2 g twice daily.  Subsequent labs have shown mild transaminitis with ALT up to 88.  Abdominal ultrasound in January showed hepatic steatosis and gallbladder sludge.  Most recent lipid panel in January showed excellent LDL 47 but persistent mild elevation in triglycerides (209). ? ?Today, Roberto Black continues to feel well.  He walks about 2 miles per day without difficulty.  He denies chest pain, shortness of breath, palpitations, lightheadedness, and edema.  He is tolerating his current medications well, including atorvastatin, fenofibrate, and Vascepa.  He has been working on his diet to help management his recently discovered hepatic steatosis. ? ?-------------------------------------------------------------------------------------------------- ? ?Cardiovascular History & Procedures: ?Cardiovascular Problems: ?Hypertriglyceridemia ?Coronary artery calcification ?  ?Risk Factors: ?Coronary artery calcification, hypertension, family history, and male gender ?  ?Cath/PCI: ?None ?  ?CV Surgery: ?None ?  ?EP Procedures and Devices: ?None ?  ?Non-Invasive Evaluation(s): ?Exercise tolerance test (06/08/2020): Low risk study without evidence of ischemia.  Good exercise capacity with hypertensive blood pressure response. ?Coronary calcium score (05/06/2020): Coronary calcium score 368 (high), 99th percentile for age and sex matched control.   Incidental note of hepatic steatosis made. ? ?Recent CV Pertinent Labs: ?Lab Results  ?Component Value Date  ? CHOL 100 05/02/2021  ? HDL 23.20 (L) 05/02/2021  ? [place] 77 03/31/2019  ? LDLDIRECT 47.0 05/02/2021  ? TRIG 209.0 (H) 05/02/2021  ? CHOLHDL 4 05/02/2021  ? INR 1.1 03/14/2007  ? K 4.2 05/02/2021  ? BUN 14 05/02/2021  ? CREATININE 0.98 05/02/2021  ? ? ?Past medical and surgical history were reviewed and updated in EPIC. ? ?Current Meds  ?Medication Sig  ? aspirin EC 81 MG tablet Take 1 tablet (81 mg total) by mouth daily. Swallow whole.  ? atorvastatin (LIPITOR) 20 MG tablet TAKE 1 TABLET BY MOUTH EVERY DAY  ? Cholecalciferol (VITAMIN D) 50 MCG (2000 UT) CAPS Take 1 capsule (2,000 Units total) by mouth daily.  ? citalopram (CELEXA) 20 MG tablet TAKE 1 TABLET BY MOUTH EVERY DAY  ? desonide (DESOWEN) 0.05 % lotion APPLY TOPICALLY 2 (TWO) TIMES DAILY. AS NEEDED. LIMIT USE AS MUCH AS POSSIBLE.  ? fenofibrate (TRICOR) 145 MG tablet TAKE 1 TABLET BY MOUTH EVERY DAY  ? hydrochlorothiazide (HYDRODIURIL) 25 MG tablet Take 1 tablet (25 mg total) by mouth daily.  ? losartan (COZAAR) 50 MG tablet TAKE 1 TABLET BY MOUTH EVERY DAY  ? VASCEPA 1 g capsule TAKE 2 CAPSULES BY MOUTH TWICE DAILY  ? ? ?Allergies: Patient has no known allergies. ? ?Social History  ? ?Tobacco Use  ? Smoking status: Never  ? Smokeless tobacco: Never  ?Vaping Use  ? Vaping Use: Never used  ?Substance Use Topics  ? Alcohol use: Yes  ?  Alcohol/week: 2.0 standard drinks  ?  Types: 2 Cans of beer per week  ?  Comment: 2 beers per week  ? Drug use: No  ? ? ?Family History  ?Problem Relation Age of Onset  ? Atrial fibrillation  Mother   ? Hyperlipidemia Father   ? CAD Father   ? Diabetes Father   ? Heart disease Father 59  ? CAD Brother 73  ?     PCI x 2  ? Hyperlipidemia Brother   ? Colon cancer Neg Hx   ? Prostate cancer Neg Hx   ? ? ?Review of Systems: ?A 12-system review of systems was performed and was negative except as noted in the  HPI. ? ?-------------------------------------------------------------------------------------------------- ? ?Physical Exam: ?BP 128/82 (BP Location: Left Arm, Patient Position: Sitting, Cuff Size: Large)   Pulse 87   Ht 6' (1.829 m)   Wt 227 lb (103 kg)   SpO2 98%   BMI 30.79 kg/m?  ? ?General:  NAD. ?Neck: No JVD or HJR. ?Lungs: Clear to auscultation bilaterally without wheezes or crackles. ?Heart: Regular rate and rhythm without murmurs, rubs, or gallops. ?Abdomen: Soft, nontender, nondistended. ?Extremities: No lower extremity edema. ? ?EKG:  Normal sinus rhythm without abnormality. ? ?Lab Results  ?Component Value Date  ? WBC 5.5 02/26/2020  ? HGB 15.8 02/26/2020  ? HCT 46.0 02/26/2020  ? MCV 91.1 02/26/2020  ? PLT 246.0 02/26/2020  ? ? ?Lab Results  ?Component Value Date  ? NA 135 05/02/2021  ? K 4.2 05/02/2021  ? CL 99 05/02/2021  ? CO2 27 05/02/2021  ? BUN 14 05/02/2021  ? CREATININE 0.98 05/02/2021  ? GLUCOSE 92 05/02/2021  ? ALT 64 (H) 07/18/2021  ? ? ?Lab Results  ?Component Value Date  ? CHOL 100 05/02/2021  ? HDL 23.20 (L) 05/02/2021  ? [place] 77 03/31/2019  ? LDLDIRECT 47.0 05/02/2021  ? TRIG 209.0 (H) 05/02/2021  ? CHOLHDL 4 05/02/2021  ? ? ?-------------------------------------------------------------------------------------------------- ? ?ASSESSMENT AND PLAN: ?Coronary artery calcification and hypertriglyceridemia: ?Roberto Black continues to be asymptomatic.  He is tolerating his current regimen of atorvastatin, fenofibrate, and Vascepa well.  I have encouraged him to continue working on lifestyle modifications.  We will plan to repeat a fasting lipid panel and hepatic function panel in ~3 months.  We have also agreed to check Lp(a) at that time for further risk stratification. ? ?Hypertension: ?BP upper normal today, similar to most home readings.  Continue current regimen of losartan and HCTZ. ? ?Follow-up: Return to clinic in 1 year. ? ?Nelva Bush, MD ?09/01/2021 ?10:51 AM ? ?

## 2021-09-01 NOTE — Patient Instructions (Signed)
Medication Instructions:  ? ?Your physician recommends that you continue on your current medications as directed. Please refer to the Current Medication list given to you today.  ? ?*If you need a refill on your cardiac medications before your next appointment, please call your pharmacy* ? ? ?Lab Work: ?Your physician recommends that you return for a FASTING lipid profile, hepatic panel, and Lp(a): In 3 months ? ?- You will need to be fasting. Please do not have anything to eat or drink after midnight the morning you have the lab work. You may only have water or black coffee with no cream or sugar.  ? ?[facility] Entrance at [facility] ?1st desk on the right to check in, past the screening table ?Lab hours: Monday- Friday (7:30 am- 5:30 pm)  ? ?If you have labs (blood work) drawn today and your tests are completely normal, you will receive your results only by: ?MyChart Message (if you have MyChart) OR ?A paper copy in the mail ?If you have any lab test that is abnormal or we need to change your treatment, we will call you to review the results. ? ? ?Testing/Procedures: ?None ordered ? ? ? ?Follow-Up: ?At [facility], you and your health needs are our priority.  As part of our continuing mission to provide you with exceptional heart care, we have created designated Provider Care Teams.  These Care Teams include your primary Cardiologist (physician) and Advanced Practice Providers (APPs -  Physician Assistants and Nurse Practitioners) who all work together to provide you with the care you need, when you need it. ? ?We recommend signing up for the patient portal called "MyChart".  Sign up information is provided on this After Visit Summary.  MyChart is used to connect with patients for Virtual Visits (Telemedicine).  Patients are able to view lab/test results, encounter notes, upcoming appointments, etc.  Non-urgent messages can be sent to your provider as well.   ?To learn more about what you can do with MyChart, go  to [email or web address].   ? ?Your next appointment:   ? ?Your physician wants you to follow-up in: 1 year.  ? ?You will receive a reminder letter in the mail two months in advance. If you don't receive a letter, please call our office to schedule the follow-up appointment.  ? ?The format for your next appointment:   ?In Person ? ?Provider:   ?You may see Nelva Bush, MD or one of the following Advanced Practice Providers on your designated Care Team:   ?Murray Hodgkins, NP ?Christell Faith, PA-C ?Cadence Kathlen Mody, PA-C ? ? ?Other Instructions ?N/A ? ?Important Information About Sugar ? ? ? ? ?  ?

## 2021-09-02 ENCOUNTER — Encounter: Payer: Self-pay | Admitting: Internal Medicine

## 2021-10-17 ENCOUNTER — Other Ambulatory Visit: Payer: Self-pay | Admitting: Family Medicine

## 2021-10-29 ENCOUNTER — Other Ambulatory Visit: Payer: Self-pay | Admitting: Family Medicine

## 2021-11-04 ENCOUNTER — Other Ambulatory Visit: Payer: Self-pay | Admitting: Internal Medicine

## 2021-11-05 ENCOUNTER — Other Ambulatory Visit: Payer: Self-pay | Admitting: Internal Medicine

## 2021-11-17 ENCOUNTER — Ambulatory Visit: Payer: BC Managed Care – PPO | Admitting: Internal Medicine

## 2021-11-29 ENCOUNTER — Other Ambulatory Visit: Payer: Self-pay | Admitting: Family Medicine

## 2022-01-03 ENCOUNTER — Other Ambulatory Visit
Admission: RE | Admit: 2022-01-03 | Discharge: 2022-01-03 | Disposition: A | Discharge status: Home / Self Care | Payer: BC Managed Care – PPO | Source: Ambulatory Visit | Attending: Internal Medicine | Admitting: Internal Medicine

## 2022-01-03 DIAGNOSIS — E781 Pure hyperglyceridemia: Secondary | ICD-10-CM | POA: Diagnosis present

## 2022-01-03 DIAGNOSIS — Z9189 Other specified personal risk factors, not elsewhere classified: Secondary | ICD-10-CM | POA: Diagnosis present

## 2022-01-03 LAB — HEPATIC FUNCTION PANEL
ALT: 62 U/L — ABNORMAL HIGH (ref 0–44)
AST: 32 U/L (ref 15–41)
Albumin: 5 g/dL (ref 3.5–5.0)
Alkaline Phosphatase: 41 U/L (ref 38–126)
Bilirubin, Direct: 0.1 mg/dL (ref 0.0–0.2)
Indirect Bilirubin: 0.2 mg/dL — ABNORMAL LOW (ref 0.3–0.9)
Total Bilirubin: 0.3 mg/dL (ref 0.3–1.2)
Total Protein: 7.8 g/dL (ref 6.5–8.1)

## 2022-01-03 LAB — LIPID PANEL
Cholesterol: 103 mg/dL (ref 0–200)
HDL: 30 mg/dL — ABNORMAL LOW
LDL Cholesterol: 50 mg/dL (ref 0–99)
Total CHOL/HDL Ratio: 3.4 ratio
Triglycerides: 115 mg/dL
VLDL: 23 mg/dL (ref 0–40)

## 2022-01-04 LAB — LIPOPROTEIN A (LPA): Lipoprotein (a): 19.5 nmol/L (ref <75.0)

## 2022-01-14 ENCOUNTER — Other Ambulatory Visit: Payer: Self-pay | Admitting: Family Medicine

## 2022-01-17 ENCOUNTER — Telehealth: Payer: Self-pay | Admitting: Family Medicine

## 2022-01-17 ENCOUNTER — Encounter: Payer: Self-pay | Admitting: Family Medicine

## 2022-01-17 MED ORDER — DESONIDE 0.05 % EX LOTN: TOPICAL_LOTION | Freq: Two times a day (BID) | CUTANEOUS | 0 refills | Status: DC

## 2022-01-17 NOTE — Telephone Encounter (Signed)
This has already been taken care of thru [employer].

## 2022-01-17 NOTE — Telephone Encounter (Signed)
Patient called and stated the lotion desonide (DESOWEN) 0.05 % lotion has expired and wanted [facility]  [id number] [address] [place]  [phone] Fax:  [phone]

## 2022-04-15 ENCOUNTER — Other Ambulatory Visit: Payer: Self-pay | Admitting: Family Medicine

## 2022-04-30 ENCOUNTER — Other Ambulatory Visit: Payer: Self-pay | Admitting: Family Medicine

## 2022-04-30 DIAGNOSIS — E781 Pure hyperglyceridemia: Secondary | ICD-10-CM

## 2022-05-09 ENCOUNTER — Other Ambulatory Visit: Payer: Self-pay | Admitting: Family Medicine

## 2022-05-09 DIAGNOSIS — E785 Hyperlipidemia, unspecified: Secondary | ICD-10-CM

## 2022-05-15 ENCOUNTER — Other Ambulatory Visit (INDEPENDENT_AMBULATORY_CARE_PROVIDER_SITE_OTHER): Payer: BC Managed Care – PPO

## 2022-05-15 DIAGNOSIS — E785 Hyperlipidemia, unspecified: Secondary | ICD-10-CM

## 2022-05-15 LAB — COMPREHENSIVE METABOLIC PANEL
ALT: 55 U/L — ABNORMAL HIGH (ref 0–53)
AST: 25 U/L (ref 0–37)
Albumin: 4.8 g/dL (ref 3.5–5.2)
Alkaline Phosphatase: 44 U/L (ref 39–117)
BUN: 17 mg/dL (ref 6–23)
CO2: 28 mEq/L (ref 19–32)
Calcium: 9.9 mg/dL (ref 8.4–10.5)
Chloride: 102 mEq/L (ref 96–112)
Creatinine, Ser: 0.9 mg/dL (ref 0.40–1.50)
GFR: 101.28 mL/min (ref >60.00)
Glucose, Bld: 96 mg/dL (ref 70–99)
Potassium: 4.1 mEq/L (ref 3.5–5.1)
Sodium: 139 mEq/L (ref 135–145)
Total Bilirubin: 0.5 mg/dL (ref 0.2–1.2)
Total Protein: 7.4 g/dL (ref 6.0–8.3)

## 2022-05-15 LAB — LIPID PANEL
Cholesterol: 112 mg/dL (ref 0–200)
HDL: 27.4 mg/dL — ABNORMAL LOW (ref >39.00)
LDL Cholesterol: 48 mg/dL (ref 0–99)
NonHDL: 84.38
Total CHOL/HDL Ratio: 4
Triglycerides: 180 mg/dL — ABNORMAL HIGH (ref 0.0–149.0)
VLDL: 36 mg/dL (ref 0.0–40.0)

## 2022-05-22 ENCOUNTER — Encounter: Payer: BC Managed Care – PPO | Admitting: Family Medicine

## 2022-05-29 ENCOUNTER — Encounter: Payer: Self-pay | Admitting: Family Medicine

## 2022-05-29 ENCOUNTER — Ambulatory Visit (INDEPENDENT_AMBULATORY_CARE_PROVIDER_SITE_OTHER): Payer: BC Managed Care – PPO | Admitting: Family Medicine

## 2022-05-29 VITALS — BP 116/62 | HR 92 | Temp 97.8°F | Ht 72.0 in | Wt 228.0 lb

## 2022-05-29 DIAGNOSIS — Z1211 Encounter for screening for malignant neoplasm of colon: Secondary | ICD-10-CM

## 2022-05-29 DIAGNOSIS — Z659 Problem related to unspecified psychosocial circumstances: Secondary | ICD-10-CM

## 2022-05-29 DIAGNOSIS — Z23 Encounter for immunization: Secondary | ICD-10-CM

## 2022-05-29 DIAGNOSIS — E781 Pure hyperglyceridemia: Secondary | ICD-10-CM

## 2022-05-29 DIAGNOSIS — Z0000 Encounter for general adult medical examination without abnormal findings: Secondary | ICD-10-CM

## 2022-05-29 DIAGNOSIS — Z7189 Other specified counseling: Secondary | ICD-10-CM

## 2022-05-29 DIAGNOSIS — I10 Essential (primary) hypertension: Secondary | ICD-10-CM

## 2022-05-29 MED ORDER — CITALOPRAM HYDROBROMIDE 20 MG PO TABS: 20.0000 mg | ORAL_TABLET | Freq: Every day | ORAL | 3 refills | Status: DC

## 2022-05-29 MED ORDER — HYDROCHLOROTHIAZIDE 25 MG PO TABS: 25.0000 mg | ORAL_TABLET | Freq: Every day | ORAL | 3 refills | Status: DC

## 2022-05-29 MED ORDER — LOSARTAN POTASSIUM 50 MG PO TABS: 50.0000 mg | ORAL_TABLET | Freq: Every day | ORAL | 3 refills | Status: DC

## 2022-05-29 MED ORDER — DESONIDE 0.05 % EX LOTN: TOPICAL_LOTION | Freq: Two times a day (BID) | CUTANEOUS | 3 refills | Status: AC

## 2022-05-29 MED ORDER — FENOFIBRATE 145 MG PO TABS: 145.0000 mg | ORAL_TABLET | Freq: Every day | ORAL | 3 refills | Status: DC

## 2022-05-29 NOTE — Patient Instructions (Addendum)
Let me know if you don't get a call from GI. Take care.  Glad to see you. Tdap today.  Update me as needed.   Thanks for your effort.

## 2022-05-29 NOTE — Progress Notes (Unsigned)
CPE- See plan.  Routine anticipatory guidance given to patient.  See health maintenance.  The possibility exists that previously documented standard health maintenance information may have been brought forward from a previous encounter into this note.  If needed, that same information has been updated to reflect the current situation based on today's encounter.    Tetanus 2024 Flu 2023 PNA and shingles not due.   covid vaccine 2021.   D/w patient [id number] for colon cancer screening, including IFOB vs. colonoscopy.  Risks and benefits of both were discussed and patient voiced understanding.  Pt elects for: colonoscopy.   prostate cancer screening not due.   Living will d/w pt.  Wife designated if patient were incapacitated. Diet and exercise.  He's working on both.  [facility]  HCV and HIV testing would have been done at that point.     Hypertension:    Using medication without problems or lightheadedness: yes Chest pain with exertion:no Edema:no Short of breath:no Diet and exercise d/w pt.   Elevated Cholesterol: Using medications without problems: yes Muscle aches: no Diet compliance: yes Exercise: yes Labs d/w pt.    Mood d/w pt.  Still on SSRI.  He feels good, doing well on SSRI.  "Things are positive."  No SI/HI.  Stressors d/w pt.   He has been working on diet and exercise and his LFTs are better.    PMH and SH reviewed  Meds, vitals, and allergies reviewed.   ROS: Per HPI.  Unless specifically indicated otherwise in HPI, the patient denies:  General: fever. Eyes: acute vision changes ENT: sore throat Cardiovascular: chest pain Respiratory: SOB GI: vomiting GU: dysuria Musculoskeletal: acute back pain Derm: acute rash Neuro: acute motor dysfunction Psych: worsening mood Endocrine: polydipsia Heme: bleeding Allergy: hayfever  GEN: nad, alert and oriented HEENT: ncat NECK: supple w/o LA CV: rrr. PULM: ctab, no inc wob ABD: soft,  +bs EXT: no edema SKIN: no acute rash

## 2022-05-30 ENCOUNTER — Other Ambulatory Visit: Payer: Self-pay

## 2022-05-30 ENCOUNTER — Telehealth: Payer: Self-pay

## 2022-05-30 DIAGNOSIS — Z1211 Encounter for screening for malignant neoplasm of colon: Secondary | ICD-10-CM

## 2022-05-30 MED ORDER — NA SULFATE-K SULFATE-MG SULF 17.5-3.13-1.6 GM/177ML PO SOLN: 1.0000 | Freq: Once | ORAL | 0 refills | Status: AC

## 2022-05-30 NOTE — Assessment & Plan Note (Signed)
No chest pain.  Continue aspirin Lipitor fenofibrate hydrochlorothiazide losartan and Vascepa.  Labs discussed with patient.  Okay for outpatient follow-up.

## 2022-05-30 NOTE — Assessment & Plan Note (Signed)
Living will d/w pt.  Wife designated if patient were incapacitated.   ?

## 2022-05-30 NOTE — Assessment & Plan Note (Signed)
Tetanus 2024 Flu 2023 PNA and shingles not due.   covid vaccine 2021.   D/w patient [id number] for colon cancer screening, including IFOB vs. colonoscopy.  Risks and benefits of both were discussed and patient voiced understanding.  Pt elects for: colonoscopy.   prostate cancer screening not due.   Living will d/w pt.  Wife designated if patient were incapacitated. Diet and exercise.  He's working on both.  [facility]  HCV and HIV testing would have been done at that point.

## 2022-05-30 NOTE — Telephone Encounter (Signed)
Gastroenterology Pre-Procedure Review  Request Date: 06/26/22 Requesting Physician: Dr. Vicente Males  PATIENT REVIEW QUESTIONS: The patient responded to the following health history questions as indicated:    1. Are you having any GI issues? no 2. Do you have a personal history of Polyps? no 3. Do you have a family history of Colon Cancer or Polyps? yes (brother polyps) 4. Diabetes Mellitus? no 5. Joint replacements in the past 12 months?no 6. Major health problems in the past 3 months?no 7. Any artificial heart valves, MVP, or defibrillator?no. Pt does have CAD. Cardiac Clearance sent to Dr. Darnelle Bos office.    MEDICATIONS & ALLERGIES:    Patient reports the following regarding taking any anticoagulation/antiplatelet therapy:   Plavix, Coumadin, Eliquis, Xarelto, Lovenox, Pradaxa, Brilinta, or Effient? no Aspirin? yes (81 mg daily)  Patient confirms/reports the following medications:  Current Outpatient Medications  Medication Sig Dispense Refill   aspirin EC 81 MG tablet Take 1 tablet (81 mg total) by mouth daily. Swallow whole. 90 tablet 3   atorvastatin (LIPITOR) 20 MG tablet TAKE 1 TABLET BY MOUTH EVERY DAY 90 tablet 2   Cholecalciferol (VITAMIN D) 50 MCG (2000 UT) CAPS Take 1 capsule (2,000 Units total) by mouth daily.     citalopram (CELEXA) 20 MG tablet Take 1 tablet (20 mg total) by mouth daily. 90 tablet 3   desonide (DESOWEN) 0.05 % lotion Apply topically 2 (two) times daily. As needed.  Limit use as much as possible. 118 mL 3   fenofibrate (TRICOR) 145 MG tablet Take 1 tablet (145 mg total) by mouth daily. 90 tablet 3   hydrochlorothiazide (HYDRODIURIL) 25 MG tablet Take 1 tablet (25 mg total) by mouth daily. 90 tablet 3   losartan (COZAAR) 50 MG tablet Take 1 tablet (50 mg total) by mouth daily. 90 tablet 3   VASCEPA 1 g capsule TAKE 2 CAPSULES BY MOUTH TWICE A DAY 120 capsule 8   No current facility-administered medications for this visit.    Patient confirms/reports the  following allergies:  No Known Allergies  No orders of the defined types were placed in this encounter.   AUTHORIZATION INFORMATION Primary Insurance: 1D#: Group #:  Secondary Insurance: 1D#: Group #:  SCHEDULE INFORMATION: Date: 06/26/22 Time: Location: [facility]

## 2022-05-30 NOTE — Assessment & Plan Note (Signed)
Mood d/w pt.  Still on SSRI.  He feels good, doing well on SSRI.  "Things are positive."  No SI/HI.  Stressors d/w pt. would continue citalopram as is.

## 2022-05-31 ENCOUNTER — Telehealth: Payer: Self-pay | Admitting: *Deleted

## 2022-05-31 NOTE — Telephone Encounter (Signed)
Call placed to pt regarding surgical clearance and the need for a tele visit.  Left pt a message to call back and ask for the preop team.

## 2022-05-31 NOTE — Telephone Encounter (Signed)
-----   Message from Marylu Lund., NP sent at 05/31/2022  7:35 AM EST ----- Regarding: RE: Clinical Please submit formal surgical request to complete preoperative clearance.  Ambrose Pancoast, NP ----- Message ----- From: Nelva Bush, MD Sent: 05/30/2022   5:50 PM EST To: [facility] Subject: FW: Clinical                                    ----- Message ----- From: Vanetta Mulders, CMA Sent: 05/30/2022   4:03 PM EST To: Nelva Bush, MD Subject: Clinical

## 2022-05-31 NOTE — Telephone Encounter (Signed)
   Pre-operative Risk Assessment    Patient Name: Roberto Black  DOB: March 23, 1976 MRN: [id number]      Request for Surgical Clearance    Procedure:   COLONOSCOPY  Date of Surgery:  Clearance 06/26/22                                 Surgeon:   Surgeon's Group or Practice Name:  [facility] Phone number:  [phone] Fax number:  [phone]   Type of Clearance Requested:   - Medical  - Pharmacy:  Hold Aspirin NOT INDICATED   Type of Anesthesia:  General    Additional requests/questions:    Astrid Divine   05/31/2022, 7:47 AM

## 2022-05-31 NOTE — Telephone Encounter (Signed)
   Name: Roberto Black  DOB: 16-Jan-1976  MRN: [id number]  Primary Cardiologist: Nelva Bush, MD   Preoperative team, please contact this patient and set up a phone call appointment for further preoperative risk assessment. Please obtain consent and complete medication review. Thank you for your help.  Per DAPT protocol patient can hold aspirin 7 days prior to surgical procedure and resume when safest surgically.  I confirm that guidance regarding antiplatelet and oral anticoagulation therapy has been completed and, if necessary, noted below.   Mable Fill, Marissa Nestle, NP 05/31/2022, 8:08 AM [place]

## 2022-06-04 NOTE — Telephone Encounter (Signed)
Left message for the pt to call back for tele appt . We have attempted x 2 for tele appt.

## 2022-06-05 NOTE — Telephone Encounter (Signed)
Our office has attempted x 3 to reach the pt to schedule a tele pre op appt. I will send FYI to surgeon office that we have not been able to reach the pt. I will remove from the pre op call back at this time until the pt calls back.

## 2022-06-06 ENCOUNTER — Telehealth: Payer: Self-pay | Admitting: Internal Medicine

## 2022-06-06 NOTE — Telephone Encounter (Signed)
  Patient Consent for Virtual Visit         Roberto Black has provided verbal consent on 06/06/2022 for a virtual visit (video or telephone).   CONSENT FOR VIRTUAL VISIT FOR:  Roberto Black  By participating in this virtual visit I agree to the following:  I hereby voluntarily request, consent and authorize [facility] and its employed or contracted physicians, physician assistants, nurse practitioners or other licensed health care professionals (the Practitioner), to provide me with telemedicine health care services (the "Services") as deemed necessary by the treating Practitioner. I acknowledge and consent to receive the Services by the Practitioner via telemedicine. I understand that the telemedicine visit will involve communicating with the Practitioner through live audiovisual communication technology and the disclosure of certain medical information by electronic transmission. I acknowledge that I have been given the opportunity to request an in-person assessment or other available alternative prior to the telemedicine visit and am voluntarily participating in the telemedicine visit.  I understand that I have the right to withhold or withdraw my consent to the use of telemedicine in the course of my care at any time, without affecting my right to future care or treatment, and that the Practitioner or I may terminate the telemedicine visit at any time. I understand that I have the right to inspect all information obtained and/or recorded in the course of the telemedicine visit and may receive copies of available information for a reasonable fee.  I understand that some of the potential risks of receiving the Services via telemedicine include:  Delay or interruption in medical evaluation due to technological equipment failure or disruption; Information transmitted may not be sufficient (e.g. poor resolution of images) to allow for appropriate medical decision making by the  Practitioner; and/or  In rare instances, security protocols could fail, causing a breach of personal health information.  Furthermore, I acknowledge that it is my responsibility to provide information about my medical history, conditions and care that is complete and accurate to the best of my ability. I acknowledge that Practitioner's advice, recommendations, and/or decision may be based on factors not within their control, such as incomplete or inaccurate data provided by me or distortions of diagnostic images or specimens that may result from electronic transmissions. I understand that the practice of medicine is not an exact science and that Practitioner makes no warranties or guarantees regarding treatment outcomes. I acknowledge that a copy of this consent can be made available to me via my patient portal ([facility]), or I can request a printed copy by calling the office of [facility].    I understand that my insurance will be billed for this visit.   I have read or had this consent read to me. I understand the contents of this consent, which adequately explains the benefits and risks of the Services being provided via telemedicine.  I have been provided ample opportunity to ask questions regarding this consent and the Services and have had my questions answered to my satisfaction. I give my informed consent for the services to be provided through the use of telemedicine in my medical care

## 2022-06-06 NOTE — Telephone Encounter (Signed)
Pt returning call regarding Tele Visit for Pre Op. Please advise

## 2022-06-07 ENCOUNTER — Telehealth: Payer: Self-pay

## 2022-06-07 NOTE — Telephone Encounter (Signed)
Contacted patient to inform him that he needs to contact his cardiologist office to schedule an appt to obtain clearance for his 06/26/22 procedure-if clearance is not granted we will need to cancel his colonoscopy and reschedule it after we have received clearance.  Asked patient to call office back to let me know he received message.  Thanks, Sharyn Lull CMA

## 2022-06-18 ENCOUNTER — Encounter: Payer: Self-pay | Admitting: Physician Assistant

## 2022-06-18 ENCOUNTER — Ambulatory Visit: Payer: BC Managed Care – PPO | Attending: Cardiology | Admitting: Physician Assistant

## 2022-06-18 DIAGNOSIS — Z01810 Encounter for preprocedural cardiovascular examination: Secondary | ICD-10-CM | POA: Diagnosis not present

## 2022-06-18 NOTE — Progress Notes (Signed)
Virtual Visit via Telephone Note   Because of Roberto Black's co-morbid illnesses, he is at least at moderate risk for complications without adequate follow up.  This format is felt to be most appropriate for this patient at this time.  The patient did not have access to video technology/had technical difficulties with video requiring transitioning to audio format only (telephone).  All issues noted in this document were discussed and addressed.  No physical exam could be performed with this format.  Please refer to the patient's chart for his consent to telehealth for [facility].  Evaluation Performed:  Preoperative cardiovascular risk assessment _____________   Date:  06/18/2022   Patient ID:  Roberto Black, DOB 08/20/1975, MRN [id number] Patient Location:  Home Provider location:   Office  Primary Care Provider:  Tonia Ghent, MD Primary Cardiologist:  Nelva Bush, MD  Chief Complaint / Patient Profile   47 y.o. y/o male with a h/o coronary calcification by prior calcium score 04/2020 (negative ETT 05/2020), HTN, hypertriglyceridemia, hepatic steatosis who is pending colonoscopy and presents today for telephonic preoperative cardiovascular risk assessment.  History of Present Illness    Roberto Black is a 47 y.o. male who presents via audio/video conferencing for a telehealth visit today.  Pt was last seen in cardiology clinic on 09/01/21 by Dr. Saunders Revel.  At that time Olan Loughnane was doing well.  The patient is now pending procedure as outlined above. We are asked to provide medical clearance from a cardiac standpoint for his procedure. Since his last visit, he has been doing well. He goes to the gym and hikes regularly multiple times a week. No recent cardiac symptoms.  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 Authorizing Provider  aspirin EC 81 MG tablet Take 1 tablet (81 mg total) by mouth daily. Swallow whole. 05/13/20   End, Harrell Gave, MD  atorvastatin (LIPITOR) 20 MG tablet TAKE 1 TABLET BY MOUTH EVERY DAY 11/06/21   End, Harrell Gave, MD  Cholecalciferol (VITAMIN D) 50 MCG (2000 UT) CAPS Take 1 capsule (2,000 Units total) by mouth daily. 02/28/20   Tonia Ghent, MD  citalopram (CELEXA) 20 MG tablet Take 1 tablet (20 mg total) by mouth daily. 05/29/22   Tonia Ghent, MD  desonide (DESOWEN) 0.05 % lotion Apply topically 2 (two) times daily. As needed.  Limit use as much as possible. 05/29/22   Tonia Ghent, MD  fenofibrate (TRICOR) 145 MG tablet Take 1 tablet (145 mg total) by mouth daily. 05/29/22   Tonia Ghent, MD  hydrochlorothiazide (HYDRODIURIL) 25 MG tablet Take 1 tablet (25 mg total) by mouth daily. 05/29/22   Tonia Ghent, MD  losartan (COZAAR) 50 MG tablet Take 1 tablet (50 mg total) by mouth daily. 05/29/22   Tonia Ghent, MD  VASCEPA 1 g capsule TAKE 2 CAPSULES BY MOUTH TWICE A DAY 11/06/21   Nelva Bush, MD    Physical Exam    Vital Signs:  Guransh Greeley does not  have vital signs available for review today. Reviewed from recent PCP OV, WNL.  Given telephonic nature of communication, physical exam is limited. AAOx3. NAD. Normal affect.  Speech and respirations are unlabored.  Accessory Clinical Findings    None  Assessment & Plan    1.  Preoperative Cardiovascular Risk Assessment: The patient affirms he has been doing well without any new cardiac symptoms. They are able to achieve over 4 METS without cardiac limitations. Therefore, based on ACC/AHA guidelines, the patient would be at acceptable risk for the planned  procedure without further cardiovascular testing. The patient was advised that if he develops new symptoms prior to surgery to contact our office to arrange for a follow-up visit, and he verbalized understanding.  We are asked for clearance to hold aspirin. Though we do not typically recommend holding aspirin for colonoscopy, patient does not have any absolute contraindications to holding and may hold up to 7 days prior to procedure and resume when felt safe by performing physician. I advised patient contact GI to clarify final instructions for holding.  A copy of this note will be routed to requesting surgeon.  Time:   Today, I have spent 5 minutes with the patient with telehealth technology discussing medical history, symptoms, and management plan.     Charlie Pitter, PA-C  06/18/2022, 9:30 AM

## 2022-06-21 ENCOUNTER — Telehealth: Payer: Self-pay

## 2022-06-21 NOTE — Telephone Encounter (Signed)
Cardiac Clearance received on 06/18/22 form [facility]. Per Melina Copa, PA-C "Patient would be at acceptable risk for the planned procedure without further cardiovascular testing".  Thanks,  [place], [place]

## 2022-06-25 ENCOUNTER — Encounter: Payer: Self-pay | Admitting: Gastroenterology

## 2022-06-26 ENCOUNTER — Encounter
Admission: RE | Disposition: A | Discharge status: Home / Self Care | Payer: Self-pay | Source: Home / Self Care | Attending: Gastroenterology

## 2022-06-26 ENCOUNTER — Other Ambulatory Visit: Payer: Self-pay

## 2022-06-26 ENCOUNTER — Ambulatory Visit
Admission: RE | Admit: 2022-06-26 | Discharge: 2022-06-26 | Disposition: A | Discharge status: Home / Self Care | Payer: BC Managed Care – PPO | Attending: Gastroenterology | Admitting: Gastroenterology

## 2022-06-26 ENCOUNTER — Ambulatory Visit: Payer: BC Managed Care – PPO | Admitting: Certified Registered"

## 2022-06-26 ENCOUNTER — Encounter: Payer: Self-pay | Admitting: Gastroenterology

## 2022-06-26 DIAGNOSIS — I2510 Atherosclerotic heart disease of native coronary artery without angina pectoris: Secondary | ICD-10-CM | POA: Insufficient documentation

## 2022-06-26 DIAGNOSIS — D124 Benign neoplasm of descending colon: Secondary | ICD-10-CM | POA: Insufficient documentation

## 2022-06-26 DIAGNOSIS — K5730 Diverticulosis of large intestine without perforation or abscess without bleeding: Secondary | ICD-10-CM | POA: Insufficient documentation

## 2022-06-26 DIAGNOSIS — Z860100 Personal history of colon polyps, unspecified: Secondary | ICD-10-CM

## 2022-06-26 DIAGNOSIS — I10 Essential (primary) hypertension: Secondary | ICD-10-CM | POA: Diagnosis not present

## 2022-06-26 DIAGNOSIS — D122 Benign neoplasm of ascending colon: Secondary | ICD-10-CM | POA: Diagnosis not present

## 2022-06-26 DIAGNOSIS — E781 Pure hyperglyceridemia: Secondary | ICD-10-CM | POA: Diagnosis not present

## 2022-06-26 DIAGNOSIS — Z83719 Family history of colon polyps, unspecified: Secondary | ICD-10-CM | POA: Insufficient documentation

## 2022-06-26 DIAGNOSIS — Z1211 Encounter for screening for malignant neoplasm of colon: Secondary | ICD-10-CM | POA: Diagnosis present

## 2022-06-26 DIAGNOSIS — J45909 Unspecified asthma, uncomplicated: Secondary | ICD-10-CM | POA: Insufficient documentation

## 2022-06-26 DIAGNOSIS — D126 Benign neoplasm of colon, unspecified: Secondary | ICD-10-CM | POA: Diagnosis not present

## 2022-06-26 HISTORY — PX: COLONOSCOPY WITH PROPOFOL: SHX5780

## 2022-06-26 SURGERY — COLONOSCOPY WITH PROPOFOL
Anesthesia: General

## 2022-06-26 MED ORDER — LIDOCAINE HCL (CARDIAC) PF 100 MG/5ML IV SOSY
PREFILLED_SYRINGE | INTRAVENOUS | Status: DC | PRN
  Administered 2022-06-26: 50 mg via INTRAVENOUS

## 2022-06-26 MED ORDER — PROPOFOL 500 MG/50ML IV EMUL
INTRAVENOUS | Status: DC | PRN
  Administered 2022-06-26: 150 ug/kg/min via INTRAVENOUS

## 2022-06-26 MED ORDER — PROPOFOL 1000 MG/100ML IV EMUL
INTRAVENOUS | Status: AC
  Filled 2022-06-26: qty 100

## 2022-06-26 MED ORDER — DEXMEDETOMIDINE HCL IN NACL 80 MCG/20ML IV SOLN
INTRAVENOUS | Status: DC | PRN
  Administered 2022-06-26: 12 ug via BUCCAL

## 2022-06-26 MED ORDER — SODIUM CHLORIDE 0.9 % IV SOLN: INTRAVENOUS | Status: DC

## 2022-06-26 MED ORDER — PROPOFOL 10 MG/ML IV BOLUS
INTRAVENOUS | Status: DC | PRN
  Administered 2022-06-26: 80 mg via INTRAVENOUS

## 2022-06-26 NOTE — Anesthesia Postprocedure Evaluation (Signed)
Anesthesia Post Note  Patient: Roberto Black  Procedure(s) Performed: COLONOSCOPY WITH PROPOFOL  Patient location during evaluation: Endoscopy Anesthesia Type: General Level of consciousness: awake and alert Pain management: pain level controlled Vital Signs Assessment: post-procedure vital signs reviewed and stable Respiratory status: spontaneous breathing, nonlabored ventilation and respiratory function stable Cardiovascular status: blood pressure returned to baseline and stable Postop Assessment: no apparent nausea or vomiting Anesthetic complications: no   No notable events documented.   Last Vitals:  Vitals:   06/26/22 0710 06/26/22 0808  BP: (!) 151/89 95/69  Pulse: 79 67  Resp: 18 15  Temp: (!) 36.3 C (!) 36.4 C  SpO2: 98% 96%    Last Pain:  Vitals:   06/26/22 0830  TempSrc:   PainSc: 0-No pain                 Alphonsus Sias

## 2022-06-26 NOTE — Transfer of Care (Signed)
Immediate Anesthesia Transfer of Care Note  Patient: Roberto Black  Procedure(s) Performed: COLONOSCOPY WITH PROPOFOL  Patient Location: PACU  Anesthesia Type:General  Level of Consciousness: sedated  Airway & Oxygen Therapy: Patient Spontanous Breathing  Post-op Assessment: Report given to RN and Post -op Vital signs reviewed and stable  Post vital signs: Reviewed and stable  Last Vitals:  Vitals Value Taken Time  BP 95/69 06/26/22 0808  Temp 36.4 C 06/26/22 0808  Pulse 66 06/26/22 0809  Resp 15 06/26/22 0809  SpO2 96 % 06/26/22 0809  Vitals shown include unvalidated device data.  Last Pain:  Vitals:   06/26/22 0808  TempSrc: Temporal  PainSc: Asleep         Complications: No notable events documented.

## 2022-06-26 NOTE — Op Note (Signed)
[facility] Gastroenterology Patient Name: Roberto Black Procedure Date: 06/26/2022 7:10 AM MRN: [id number] Account #: [id number] Date of Birth: [birth year] Admit Type: Outpatient Age: 47 Room: [facility] ENDO ROOM 2 Gender: Male Note Status: Finalized Instrument Name: [name] [id number] Procedure:             Colonoscopy Indications:           Colon cancer screening in patient at increased risk:                         Family history of 1st-degree relative with colon polyps Providers:             Jonathon Bellows MD, MD Referring MD:          Jonathon Bellows MD, MD (Referring MD), Elveria Rising. Damita Dunnings, MD                         (Referring MD) Medicines:             Monitored Anesthesia Care Complications:         No immediate complications. Procedure:             Pre-Anesthesia Assessment:                        - Prior to the procedure, a History and Physical was                         performed, and patient medications, allergies and                         sensitivities were reviewed. The patient's tolerance                         of previous anesthesia was reviewed.                        - The risks and benefits of the procedure and the                         sedation options and risks were discussed with the                         patient. All questions were answered and informed                         consent was obtained.                        - ASA Grade Assessment: II - A patient with mild                         systemic disease.                        After obtaining informed consent, the colonoscope was                         passed under direct vision. Throughout the procedure,  the patient's blood pressure, pulse, and oxygen                         saturations were monitored continuously. The                         Colonoscope was introduced through the anus and                         advanced to the the cecum, identified by the                          appendiceal orifice. The colonoscopy was performed                         with ease. The patient tolerated the procedure well.                         The quality of the bowel preparation was excellent.                         The ileocecal valve, appendiceal orifice, and rectum                         were photographed. Findings:      The perianal and digital rectal examinations were normal.      Two sessile polyps were found in the descending colon and ascending       colon. The polyps were 5 to 7 mm in size. These polyps were removed with       a cold snare. Resection and retrieval were complete.      Multiple small-mouthed diverticula were found in the sigmoid colon.      The exam was otherwise without abnormality on direct and retroflexion       views. Impression:            - Two 5 to 7 mm polyps in the descending colon and in                         the ascending colon, removed with a cold snare.                         Resected and retrieved.                        - Diverticulosis in the sigmoid colon.                        - The examination was otherwise normal on direct and                         retroflexion views. Recommendation:        - Discharge patient to home (with escort).                        - Resume previous diet.                        - Continue present medications.                        -  Await pathology results.                        - Repeat colonoscopy for surveillance based on                         pathology results. Procedure Code(s):     --- Professional ---                        [phone], Colonoscopy, flexible; with removal of                         tumor(s), polyp(s), or other lesion(s) by snare                         technique Diagnosis Code(s):     --- Professional ---                        Z83.71, Family history of colonic polyps                        D12.4, Benign neoplasm of descending colon                        D12.2,  Benign neoplasm of ascending colon                        K57.30, Diverticulosis of large intestine without                         perforation or abscess without bleeding CPT copyright 2022 American Medical Association. All rights reserved. The codes documented in this report are preliminary and upon coder review may  be revised to meet current compliance requirements. Jonathon Bellows, MD Jonathon Bellows MD, MD 06/26/2022 8:07:51 AM This report has been signed electronically. Number of Addenda: 0 Note Initiated On: 06/26/2022 7:10 AM Scope Withdrawal Time: 0 hours 13 minutes 20 seconds  Total Procedure Duration: 0 hours 17 minutes 1 second  Estimated Blood Loss:  Estimated blood loss: none.      [facility]

## 2022-06-26 NOTE — Anesthesia Procedure Notes (Signed)
Procedure Name: MAC Date/Time: 06/26/2022 7:45 AM  Performed by: Biagio Borg, CRNAPre-anesthesia Checklist: Patient identified, Emergency Drugs available, Suction available, Patient being monitored and Timeout performed Patient Re-evaluated:Patient Re-evaluated prior to induction Oxygen Delivery Method: Nasal cannula Induction Type: IV induction Placement Confirmation: positive ETCO2 and CO2 detector

## 2022-06-26 NOTE — Anesthesia Preprocedure Evaluation (Signed)
Anesthesia Evaluation  Patient identified by MRN, date of birth, ID band Patient awake    Reviewed: Allergy & Precautions, NPO status , Patient's Chart, lab work & pertinent test results  Airway Mallampati: III  TM Distance: >3 FB Neck ROM: full    Dental  (+) Caps   Pulmonary neg pulmonary ROS, asthma    Pulmonary exam normal        Cardiovascular hypertension, + CAD  Normal cardiovascular exam  Eval by cards, cleared for procedure ^4METS   Neuro/Psych negative neurological ROS  negative psych ROS   GI/Hepatic negative GI ROS, Neg liver ROS,neg GERD  ,,  Endo/Other  negative endocrine ROS    Renal/GU negative Renal ROS  negative genitourinary   Musculoskeletal   Abdominal   Peds  Hematology negative hematology ROS (+)   Anesthesia Other Findings Past Medical History: No date: Asthma     Comment:  childhood No date: Carotid body tumor ([facility]) No date: Hypertension No date: Hypertriglyceridemia  Past Surgical History: 2008: CAROTID BODY TUMOR EXCISION     Comment:  right, benign No date: VASECTOMY     Reproductive/Obstetrics negative OB ROS                             Anesthesia Physical Anesthesia Plan  ASA: 2  Anesthesia Plan: General   Post-op Pain Management:    Induction: Intravenous  PONV Risk Score and Plan: Propofol infusion and TIVA  Airway Management Planned: Natural Airway and Nasal Cannula  Additional Equipment:   Intra-op Plan:   Post-operative Plan:   Informed Consent: I have reviewed the patients History and Physical, chart, labs and discussed the procedure including the risks, benefits and alternatives for the proposed anesthesia with the patient or authorized representative who has indicated his/her understanding and acceptance.     Dental Advisory Given  Plan Discussed with: Anesthesiologist, CRNA and Surgeon  Anesthesia Plan Comments: (Patient  consented for risks of anesthesia including but not limited to:  - adverse reactions to medications - risk of airway placement if required - damage to eyes, teeth, lips or other oral mucosa - nerve damage due to positioning  - sore throat or hoarseness - Damage to heart, brain, nerves, lungs, other parts of body or loss of life  Patient voiced understanding.)       Anesthesia Quick Evaluation

## 2022-06-26 NOTE — H&P (Signed)
Jonathon Bellows, MD [address], [place], [place], [place], [address] [address], [place], [place], [place], [address] Phone: [phone]  Fax: [phone]  Primary Care Physician:  Tonia Ghent, MD   Pre-Procedure History & Physical: HPI:  Roberto Black is a 47 y.o. male is here for an colonoscopy.   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 Authorizing Provider  aspirin EC 81 MG tablet Take 1 tablet (81 mg total) by mouth daily. Swallow whole. 05/13/20  Yes End, Harrell Gave, MD  atorvastatin (LIPITOR) 20 MG tablet TAKE 1 TABLET BY MOUTH EVERY DAY 11/06/21  Yes End, Harrell Gave, MD  Cholecalciferol (VITAMIN D) 50 MCG (2000 UT) CAPS Take 1 capsule (2,000 Units total) by mouth daily. 02/28/20  Yes Tonia Ghent, MD  citalopram (CELEXA) 20 MG tablet Take 1 tablet (20 mg total) by mouth daily. 05/29/22  Yes Tonia Ghent, MD  desonide (DESOWEN) 0.05 % lotion Apply topically 2 (two) times daily. As needed.  Limit use as much as possible. 05/29/22  Yes Tonia Ghent, MD  fenofibrate (TRICOR) 145 MG tablet Take 1 tablet (145 mg total) by mouth daily. 05/29/22  Yes Tonia Ghent, MD  hydrochlorothiazide (HYDRODIURIL) 25 MG tablet Take 1 tablet (25 mg total) by mouth daily. 05/29/22  Yes Tonia Ghent, MD  losartan (COZAAR) 50 MG tablet Take 1 tablet (50 mg total) by mouth daily. 05/29/22  Yes Tonia Ghent, MD  VASCEPA 1 g capsule TAKE 2 CAPSULES BY MOUTH TWICE A DAY 11/06/21  Yes End, Harrell Gave, MD    Allergies as of 05/31/2022   (No Known Allergies)    Family History  Problem Relation Age of Onset   Atrial fibrillation Mother    Hyperlipidemia Father    CAD Father     Diabetes Father    Heart disease Father 25   CAD Brother 64       PCI x 2   Hyperlipidemia Brother    Colon cancer Neg Hx    Prostate cancer Neg Hx     Social History   Socioeconomic History   Marital status: Married    Spouse name: Not on file   Number of children: Not on file   Years of education: Not on file   Highest education level: Not on file  Occupational History   Not on file  Tobacco Use   Smoking status: Never   Smokeless tobacco: Never  Vaping Use   Vaping Use: Never used  Substance and Sexual Activity   Alcohol use: Yes    Alcohol/week: 2.0 standard drinks of alcohol    Types: 2 Cans of beer per week    Comment: 2 beers per week   Drug use: No   Sexual activity: Not on file  Other Topics Concern   Not on file  Social History Narrative   Education:  BA degree - RN, Engineer, 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   Social Determinants of Health   Financial Resource Strain: Not on file  Food Insecurity:  Not on file  Transportation Needs: Not on file  Physical Activity: Not on file  Stress: Not on file  Social Connections: Not on file  Intimate Partner Violence: Not on file    Review of Systems: See HPI, otherwise negative ROS  Physical Exam: BP (!) 151/89   Pulse 79   Temp (!) 97.4 F (36.3 C) (Temporal)   Resp 18   Ht 6' (1.829 m)   Wt 97.1 kg   SpO2 98%   BMI 29.02 kg/m  General:   Alert,  pleasant and cooperative in NAD Head:  Normocephalic and atraumatic. Neck:  Supple; no masses or thyromegaly. Lungs:  Clear throughout to auscultation, normal respiratory effort.    Heart:  +S1, +S2, Regular rate and rhythm, No edema. Abdomen:  Soft, nontender and nondistended. Normal bowel sounds, without guarding, and without rebound.   Neurologic:  Alert and  oriented x4;  grossly normal neurologically.  Impression/Plan: Roberto Black is here for an colonoscopy to be performed for Screening colonoscopybrother has  colon polyps Risks, benefits, limitations, and alternatives regarding  colonoscopy have been reviewed with the patient.  Questions have been answered.  All parties agreeable.   Jonathon Bellows, MD  06/26/2022, 7:41 AM

## 2022-06-27 ENCOUNTER — Encounter: Payer: Self-pay | Admitting: Gastroenterology

## 2022-06-27 LAB — SURGICAL PATHOLOGY

## 2022-06-29 ENCOUNTER — Encounter: Payer: Self-pay | Admitting: Gastroenterology

## 2022-07-23 ENCOUNTER — Other Ambulatory Visit: Payer: Self-pay | Admitting: Internal Medicine

## 2022-07-23 NOTE — Telephone Encounter (Signed)
Please schedule 12 month F/U appt for 90 day refills. Thank you! 

## 2022-07-26 ENCOUNTER — Other Ambulatory Visit: Payer: Self-pay

## 2022-07-26 MED ORDER — ATORVASTATIN CALCIUM 20 MG PO TABS: 20.0000 mg | ORAL_TABLET | Freq: Every day | ORAL | 0 refills | Status: DC

## 2022-08-30 ENCOUNTER — Ambulatory Visit: Payer: BC Managed Care – PPO | Attending: Internal Medicine | Admitting: Internal Medicine

## 2022-08-30 ENCOUNTER — Encounter: Payer: Self-pay | Admitting: Internal Medicine

## 2022-08-30 VITALS — BP 112/78 | HR 82 | Ht 72.0 in | Wt 224.4 lb

## 2022-08-30 DIAGNOSIS — R7401 Elevation of levels of liver transaminase levels: Secondary | ICD-10-CM | POA: Diagnosis not present

## 2022-08-30 DIAGNOSIS — I10 Essential (primary) hypertension: Secondary | ICD-10-CM

## 2022-08-30 DIAGNOSIS — I2510 Atherosclerotic heart disease of native coronary artery without angina pectoris: Secondary | ICD-10-CM

## 2022-08-30 DIAGNOSIS — E781 Pure hyperglyceridemia: Secondary | ICD-10-CM

## 2022-08-30 DIAGNOSIS — K760 Fatty (change of) liver, not elsewhere classified: Secondary | ICD-10-CM

## 2022-08-30 NOTE — Progress Notes (Signed)
Follow-up Outpatient Visit Date: 08/30/2022  Primary Care Provider: Joaquim Nam, MD [address] [place] [address]  Chief Complaint: Follow-up hyperlipidemia and coronary artery calcification  HPI:  [name] is a 47 y.o. male with history of hypertriglyceridemia and family history of coronary artery disease, who presents for follow-up of coronary artery calcification.  I last saw him a year ago, which time he was feeling well.  He was tolerating his lipid therapy well.  We agreed to continue Vascepa, atorvastatin, and fenofibrate for cholesterol control as well as losartan and HCTZ for blood pressure management.  Most recent lipid panel in January was notable for LDL of 48 and triglycerides of 180.  Today, [name] reports that he has continued to do well.  He denies chest pain, shortness of breath, palpitations, lightheadedness, and edema.  He is tolerating his medications without side effects.  Blood pressure remains well-controlled.  He exercises regularly without any difficulty.  --------------------------------------------------------------------------------------------------  Cardiovascular History & Procedures: Cardiovascular Problems: Hypertriglyceridemia Coronary artery calcification   Risk Factors: Coronary artery calcification, hypertension, family history, and male gender   Cath/PCI: None   CV Surgery: None   EP Procedures and Devices: None   Non-Invasive Evaluation(s): Exercise tolerance test (06/08/2020): Low risk study without evidence of ischemia.  Good exercise capacity with hypertensive blood pressure response. Coronary calcium score (05/06/2020): Coronary calcium score 368 (high), 99th percentile for age and sex matched control.  Incidental note of hepatic steatosis made.  Recent CV Pertinent Labs: Lab Results  Component Value Date   CHOL 112 05/15/2022   HDL 27.40 (L) 05/15/2022   LDLCALC 48 05/15/2022   LDLDIRECT 47.0 05/02/2021    TRIG 180.0 (H) 05/15/2022   CHOLHDL 4 05/15/2022   INR 1.1 03/14/2007   K 4.1 05/15/2022   BUN 17 05/15/2022   CREATININE 0.90 05/15/2022    Past medical and surgical history were reviewed and updated in EPIC.  Current Meds  Medication Sig   aspirin EC 81 MG tablet Take 1 tablet (81 mg total) by mouth daily. Swallow whole.   atorvastatin (LIPITOR) 20 MG tablet Take 1 tablet (20 mg total) by mouth daily.   Cholecalciferol (VITAMIN D) 50 MCG (2000 UT) CAPS Take 1 capsule (2,000 Units total) by mouth daily.   citalopram (CELEXA) 20 MG tablet Take 1 tablet (20 mg total) by mouth daily.   desonide (DESOWEN) 0.05 % lotion Apply topically 2 (two) times daily. As needed.  Limit use as much as possible.   fenofibrate (TRICOR) 145 MG tablet Take 1 tablet (145 mg total) by mouth daily.   hydrochlorothiazide (HYDRODIURIL) 25 MG tablet Take 1 tablet (25 mg total) by mouth daily.   losartan (COZAAR) 50 MG tablet Take 1 tablet (50 mg total) by mouth daily.   VASCEPA 1 g capsule TAKE 2 CAPSULES BY MOUTH TWICE A DAY    Allergies: Patient has no known allergies.  Social History   Tobacco Use   Smoking status: Never   Smokeless tobacco: Never  Vaping Use   Vaping Use: Never used  Substance Use Topics   Alcohol use: Yes    Alcohol/week: 2.0 standard drinks of alcohol    Types: 2 Cans of beer per week    Comment: 2 beers per week   Drug use: No    Family History  Problem Relation Age of Onset   Atrial fibrillation Mother    Hyperlipidemia Father    CAD Father    Diabetes Father  Heart disease Father 63   CAD Brother 67       PCI x 2   Hyperlipidemia Brother    Colon cancer Neg Hx    Prostate cancer Neg Hx     Review of Systems: A 12-system review of systems was performed and was negative except as noted in the HPI.  --------------------------------------------------------------------------------------------------  Physical Exam: BP 112/78 (BP Location: Left Arm, Patient  Position: Sitting, Cuff Size: Normal)   Pulse 82   Ht 6' (1.829 m)   Wt 224 lb 6 oz (101.8 kg)   SpO2 95%   BMI 30.43 kg/m   General:  NAD. Neck: No JVD or HJR. Lungs: Clear to auscultation bilaterally without wheezes or crackles. Heart: Regular rate and rhythm without murmurs, rubs, or gallops. Abdomen: Soft, nontender, nondistended. Extremities: No lower extremity edema.  EKG: Normal sinus rhythm without abnormalities.  Lab Results  Component Value Date   WBC 5.5 02/26/2020   HGB 15.8 02/26/2020   HCT 46.0 02/26/2020   MCV 91.1 02/26/2020   PLT 246.0 02/26/2020    Lab Results  Component Value Date   NA 139 05/15/2022   K 4.1 05/15/2022   CL 102 05/15/2022   CO2 28 05/15/2022   BUN 17 05/15/2022   CREATININE 0.90 05/15/2022   GLUCOSE 96 05/15/2022   ALT 55 (H) 05/15/2022    Lab Results  Component Value Date   CHOL 112 05/15/2022   HDL 27.40 (L) 05/15/2022   LDLCALC 48 05/15/2022   LDLDIRECT 47.0 05/02/2021   TRIG 180.0 (H) 05/15/2022   CHOLHDL 4 05/15/2022    --------------------------------------------------------------------------------------------------  ASSESSMENT AND PLAN: Coronary artery disease without angina: [name] does not have any angina.  Continue aggressive medical therapy to prevent progression of disease.  Hypertriglyceridemia: Most recent lipid panel in January notable for excellent LDL but mildly elevated triglycerides and low HDL.  Continue current regimen of atorvastatin, fenofibrate, and Vascepa.  Essential hypertension: Blood pressure well-controlled today.  Continue losartan.  Transaminitis and hepatic steatosis: Mild persistent elevation of ALT noted.  Abdominal ultrasound ordered by Dr. Para March in January showed gallbladder sludge and hepatic steatosis.  Mr. Thissell is asymptomatic.  I think it is reasonable to continue his current lipid therapy given very mild elevation of ALT.  I encouraged [name] to minimize his alcohol  intake.  Follow-up: Return to clinic in 1 year.  Yvonne Kendall, MD 08/30/2022 8:18 AM

## 2022-08-30 NOTE — Patient Instructions (Signed)
Medication Instructions:  Your Physician recommend you continue on your current medication as directed.    *If you need a refill on your cardiac medications before your next appointment, please call your pharmacy*   Lab Work: None ordered today   Testing/Procedures: None ordered today   Follow-Up: At [facility], you and your health needs are our priority.  As part of our continuing mission to provide you with exceptional heart care, we have created designated Provider Care Teams.  These Care Teams include your primary Cardiologist (physician) and Advanced Practice Providers (APPs -  Physician Assistants and Nurse Practitioners) who all work together to provide you with the care you need, when you need it.  We recommend signing up for the patient portal called "MyChart".  Sign up information is provided on this After Visit Summary.  MyChart is used to connect with patients for Virtual Visits (Telemedicine).  Patients are able to view lab/test results, encounter notes, upcoming appointments, etc.  Non-urgent messages can be sent to your provider as well.   To learn more about what you can do with MyChart, go to [email or web address].    Your next appointment:   1 year(s)  Provider:   You may see Christopher End, MD or one of the following Advanced Practice Providers on your designated Care Team:   Christopher Berge, NP Ryan Dunn, PA-C Cadence Furth, PA-C Sheri Hammock, NP    

## 2022-09-20 ENCOUNTER — Other Ambulatory Visit: Payer: Self-pay | Admitting: Internal Medicine

## 2022-10-25 ENCOUNTER — Other Ambulatory Visit: Payer: Self-pay | Admitting: Internal Medicine

## 2022-10-26 MED ORDER — ATORVASTATIN CALCIUM 20 MG PO TABS: 20.0000 mg | ORAL_TABLET | Freq: Every day | ORAL | 3 refills | Status: DC

## 2022-10-26 NOTE — Addendum Note (Signed)
Addended by: Guerry Minors on: 10/26/2022 08:48 AM   Modules accepted: Orders

## 2023-05-26 ENCOUNTER — Other Ambulatory Visit: Payer: Self-pay | Admitting: Family Medicine

## 2023-05-26 DIAGNOSIS — I10 Essential (primary) hypertension: Secondary | ICD-10-CM

## 2023-05-29 ENCOUNTER — Other Ambulatory Visit: Payer: Self-pay | Admitting: Family Medicine

## 2023-05-30 NOTE — Telephone Encounter (Signed)
 Sent. Thanks.

## 2023-05-31 ENCOUNTER — Other Ambulatory Visit (INDEPENDENT_AMBULATORY_CARE_PROVIDER_SITE_OTHER): Payer: 59

## 2023-05-31 DIAGNOSIS — I10 Essential (primary) hypertension: Secondary | ICD-10-CM

## 2023-05-31 LAB — COMPREHENSIVE METABOLIC PANEL
ALT: 78 U/L — ABNORMAL HIGH (ref 0–53)
AST: 27 U/L (ref 0–37)
Albumin: 4.6 g/dL (ref 3.5–5.2)
Alkaline Phosphatase: 55 U/L (ref 39–117)
BUN: 21 mg/dL (ref 6–23)
CO2: 26 meq/L (ref 19–32)
Calcium: 9.2 mg/dL (ref 8.4–10.5)
Chloride: 106 meq/L (ref 96–112)
Creatinine, Ser: 0.88 mg/dL (ref 0.40–1.50)
GFR: 101.23 mL/min (ref >60.00)
Glucose, Bld: 91 mg/dL (ref 70–99)
Potassium: 4.3 meq/L (ref 3.5–5.1)
Sodium: 141 meq/L (ref 135–145)
Total Bilirubin: 0.4 mg/dL (ref 0.2–1.2)
Total Protein: 7.1 g/dL (ref 6.0–8.3)

## 2023-05-31 LAB — LIPID PANEL
Cholesterol: 98 mg/dL (ref 0–200)
HDL: 29.2 mg/dL — ABNORMAL LOW (ref >39.00)
LDL Cholesterol: 44 mg/dL (ref 0–99)
NonHDL: 68.89
Total CHOL/HDL Ratio: 3
Triglycerides: 123 mg/dL (ref 0.0–149.0)
VLDL: 24.6 mg/dL (ref 0.0–40.0)

## 2023-06-07 ENCOUNTER — Ambulatory Visit (INDEPENDENT_AMBULATORY_CARE_PROVIDER_SITE_OTHER): Payer: 59 | Admitting: Family Medicine

## 2023-06-07 ENCOUNTER — Encounter: Payer: Self-pay | Admitting: Family Medicine

## 2023-06-07 VITALS — BP 102/58 | HR 76 | Temp 98.0°F | Ht 72.84 in | Wt 225.4 lb

## 2023-06-07 DIAGNOSIS — Z0000 Encounter for general adult medical examination without abnormal findings: Secondary | ICD-10-CM | POA: Diagnosis not present

## 2023-06-07 DIAGNOSIS — I10 Essential (primary) hypertension: Secondary | ICD-10-CM

## 2023-06-07 DIAGNOSIS — K760 Fatty (change of) liver, not elsewhere classified: Secondary | ICD-10-CM

## 2023-06-07 DIAGNOSIS — Z7189 Other specified counseling: Secondary | ICD-10-CM

## 2023-06-07 DIAGNOSIS — E781 Pure hyperglyceridemia: Secondary | ICD-10-CM

## 2023-06-07 DIAGNOSIS — Z659 Problem related to unspecified psychosocial circumstances: Secondary | ICD-10-CM

## 2023-06-07 MED ORDER — LOSARTAN POTASSIUM 50 MG PO TABS: 50.0000 mg | ORAL_TABLET | Freq: Every day | ORAL | 3 refills | Status: AC

## 2023-06-07 MED ORDER — FENOFIBRATE 145 MG PO TABS: 145.0000 mg | ORAL_TABLET | Freq: Every day | ORAL | 3 refills | Status: DC

## 2023-06-07 MED ORDER — TACROLIMUS 0.1 % EX CREA: TOPICAL_CREAM | CUTANEOUS | Status: AC

## 2023-06-07 NOTE — Progress Notes (Signed)
 CPE- See plan.  Routine anticipatory guidance given to patient.  See health maintenance.  The possibility exists that previously documented standard health maintenance information may have been brought forward from a previous encounter into this note.  If needed, that same information has been updated to reflect the current situation based on today's encounter.    Tetanus 2024 Flu 2024 PNA and shingles not due.   covid vaccine 2021.   Colonoscopy 2024 prostate cancer screening not due.   Living will d/w pt.  Wife designated if patient were incapacitated. Diet and exercise.  He's working on both.  [facility]  HCV and HIV testing would have been done at that point.     Elevated Cholesterol: Using medications without problems:yes Muscle aches: no Diet compliance: yes Exercise: yes  LFT elevation.  H/o fatty liver.  On statin and fenofibrate.  See following phone note.  I would appreciate input from cardiology and GI.  He is feeling well otherwise without jaundice or abdominal pain.  Mood d/w pt. Mood is good, no SI/HI.  Compliant with citalopram.  He wanted to continue.    Hypertension:    Using medication without problems or lightheadedness: yes Chest pain with exertion:no Edema:no Short of breath:no Labs d/w pt.    PMH and SH reviewed  Meds, vitals, and allergies reviewed.   ROS: Per HPI.  Unless specifically indicated otherwise in HPI, the patient denies:  General: fever. Eyes: acute vision changes ENT: sore throat Cardiovascular: chest pain Respiratory: SOB GI: vomiting GU: dysuria Musculoskeletal: acute back pain Derm: acute rash Neuro: acute motor dysfunction Psych: worsening mood Endocrine: polydipsia Heme: bleeding Allergy: hayfever  GEN: nad, alert and oriented HEENT: ncat NECK: supple w/o LA CV: rrr. PULM: ctab, no inc wob ABD: soft, +bs EXT: no edema SKIN: no acute rash

## 2023-06-07 NOTE — Patient Instructions (Signed)
Don't change your meds for now.  Update me as needed.  Take care.  Glad to see you. Thanks for your effort.  I'll check with GI and cardiology.

## 2023-06-09 ENCOUNTER — Telehealth: Payer: Self-pay | Admitting: Family Medicine

## 2023-06-09 DIAGNOSIS — K760 Fatty (change of) liver, not elsewhere classified: Secondary | ICD-10-CM

## 2023-06-09 NOTE — Assessment & Plan Note (Signed)
 H/o fatty liver.  On statin and fenofibrate.  See following phone note.  I would appreciate input from cardiology and GI.  He is feeling well otherwise without jaundice or abdominal pain.

## 2023-06-09 NOTE — Telephone Encounter (Signed)
 This patient has a history of fatty liver.  He is feeling well without jaundice.  He has a mild transaminitis and he is currently treated with atorvastatin.  I think it likely makes sense to continue atorvastatin as is but I wanted your input.  He is not taking any Tylenol and drinks very minimal alcohol (ie very rarely).    Many thanks.

## 2023-06-09 NOTE — Assessment & Plan Note (Signed)
 Tetanus 2024 Flu 2024 PNA and shingles not due.   covid vaccine 2021.   Colonoscopy 2024 prostate cancer screening not due.   Living will d/w pt.  Wife designated if patient were incapacitated. Diet and exercise.  He's working on both.  [facility]  HCV and HIV testing would have been done at that point.

## 2023-06-09 NOTE — Assessment & Plan Note (Signed)
 Labs discussed with patient.  Continue Vascepa fenofibrate and atorvastatin.

## 2023-06-09 NOTE — Assessment & Plan Note (Signed)
 Living will d/w pt.  Wife designated if patient were incapacitated.   ?

## 2023-06-09 NOTE — Assessment & Plan Note (Signed)
 Labs discussed with patient.  Continue losartan hydrochlorothiazide.

## 2023-06-09 NOTE — Assessment & Plan Note (Signed)
 Mood is good, no SI/HI.  Compliant with citalopram.  He wanted to continue.   Update me as needed.

## 2023-06-10 NOTE — Telephone Encounter (Signed)
 AV-Please update patient.  I checked with Dr. Tobi Bastos and Dr. Okey Dupre.  The consensus is to continue his cholesterol meds.  It would be worthwhile to calculate a fib4 score for fatty liver and if > 1.4 have him talk to Dr. Tobi Bastos.  I have all the data I need except for a recent CBC.  Please see if he can come by the lab for a nonfasting CBC.  I put in the order.    I can calculate his score using his age and LFTs and platelet count and we can go from there.  Thanks.

## 2023-06-10 NOTE — Addendum Note (Signed)
 Addended by: Joaquim Nam on: 06/10/2023 02:43 PM   Modules accepted: Orders

## 2023-06-10 NOTE — Telephone Encounter (Signed)
 I agree.  As long as he is asymptomatic and his transaminitis is not worsening, I think the benefits of atorvastatin outweigh the risks for now.

## 2023-06-10 NOTE — Telephone Encounter (Signed)
 Patient has been notified and scheduled for labs

## 2023-06-13 ENCOUNTER — Other Ambulatory Visit: Payer: 59

## 2023-06-13 ENCOUNTER — Other Ambulatory Visit (INDEPENDENT_AMBULATORY_CARE_PROVIDER_SITE_OTHER): Payer: 59

## 2023-06-13 DIAGNOSIS — K760 Fatty (change of) liver, not elsewhere classified: Secondary | ICD-10-CM | POA: Diagnosis not present

## 2023-06-14 LAB — CBC WITH DIFFERENTIAL/PLATELET
Basophils Absolute: 0.1 10*3/uL (ref 0.0–0.1)
Basophils Relative: 0.7 % (ref 0.0–3.0)
Eosinophils Absolute: 0.2 10*3/uL (ref 0.0–0.7)
Eosinophils Relative: 2.7 % (ref 0.0–5.0)
HCT: 45.2 % (ref 39.0–52.0)
Hemoglobin: 15.5 g/dL (ref 13.0–17.0)
Lymphocytes Relative: 30.1 % (ref 12.0–46.0)
Lymphs Abs: 2.1 10*3/uL (ref 0.7–4.0)
MCHC: 34.3 g/dL (ref 30.0–36.0)
MCV: 94.1 fL (ref 78.0–100.0)
Monocytes Absolute: 1.1 10*3/uL — ABNORMAL HIGH (ref 0.1–1.0)
Monocytes Relative: 15.3 % — ABNORMAL HIGH (ref 3.0–12.0)
Neutro Abs: 3.6 10*3/uL (ref 1.4–7.7)
Neutrophils Relative %: 51.2 % (ref 43.0–77.0)
Platelets: 241 10*3/uL (ref 150.0–400.0)
RBC: 4.8 Mil/uL (ref 4.22–5.81)
RDW: 13.3 % (ref 11.5–15.5)
WBC: 7 10*3/uL (ref 4.0–10.5)

## 2023-06-16 ENCOUNTER — Encounter: Payer: Self-pay | Admitting: Family Medicine

## 2023-09-13 ENCOUNTER — Telehealth: Payer: Self-pay

## 2023-09-13 NOTE — Telephone Encounter (Signed)
 Noted. Thanks.

## 2023-09-13 NOTE — Transitions of Care (Post Inpatient/ED Visit) (Signed)
   09/13/2023  Name: Roberto Black MRN: [id number] DOB: 02-Sep-1975  Today's TOC FU Call Status: Today's TOC FU Call Status:: Successful TOC FU Call Completed TOC FU Call Complete Date: 09/13/23 Patient's Name and Date of Birth confirmed.  Transition Care Management Follow-up Telephone Call Date of Discharge: 09/12/23 Discharge Facility: Other [facility]) Name of Other (Non[facility]) Discharge Facility: [facility] Type of Discharge: Inpatient Admission Primary Inpatient Discharge Diagnosis:: diverticulitis How have you been since you were released from the hospital?: Better Any questions or concerns?: No  Items Reviewed: Did you receive and understand the discharge instructions provided?: Yes Medications obtained,verified, and reconciled?: Yes (Medications Reviewed) Any new allergies since your discharge?: No Dietary orders reviewed?: Yes Do you have support at home?: Yes People in Home [RPT]: spouse  Medications Reviewed Today: Medications Reviewed Today     Reviewed by Darrall Ellison, LPN (Licensed Practical Nurse) on 09/13/23 at 1113  Med List Status: <None>   Medication Order Taking? Sig Documenting Provider Last Dose Status Informant  aspirin  EC 81 MG tablet [id number] No Take 1 tablet (81 mg total) by mouth daily. Swallow whole. End, Veryl Gottron, MD Taking Active   atorvastatin  (LIPITOR) 20 MG tablet [id number] No Take 1 tablet (20 mg total) by mouth daily. End, Veryl Gottron, MD Taking Active   Cholecalciferol (VITAMIN D ) 50 MCG (2000 UT) CAPS [id number] No Take 1 capsule (2,000 Units total) by mouth daily. Donnie Galea, MD Taking Active   citalopram  (CELEXA ) 20 MG tablet [id number] No TAKE 1 TABLET BY MOUTH EVERY DAY Donnie Galea, MD Taking Active   desonide  (DESOWEN ) 0.05 % lotion [id number] No Apply topically 2 (two) times daily. As needed.  Limit use as much as possible. Donnie Galea, MD Taking Active   fenofibrate  (TRICOR ) 145 MG tablet [id number]  Take 1  tablet (145 mg total) by mouth daily. Donnie Galea, MD  Active   fluocinonide (LIDEX) 0.05 % external solution [id number]  Apply 1 Application topically 2 (two) times daily. [provider]  Active   hydrochlorothiazide  (HYDRODIURIL ) 25 MG tablet [id number] No TAKE 1 TABLET (25 MG TOTAL) BY MOUTH DAILY. Donnie Galea, MD Taking Active   losartan  (COZAAR ) 50 MG tablet [id number]  Take 1 tablet (50 mg total) by mouth daily. Donnie Galea, MD  Active   Tacrolimus  0.1 % CREA [id number]  Use as directed per dermatology. Donnie Galea, MD  Active   VASCEPA  1 g capsule [id number] No TAKE 2 CAPSULES BY MOUTH TWICE A DAY End, Christopher, MD Taking Active             Home Care and Equipment/Supplies: Were Home Health Services Ordered?: NA Any new equipment or medical supplies ordered?: NA  Functional Questionnaire: Do you need assistance with bathing/showering or dressing?: No Do you need assistance with meal preparation?: No Do you need assistance with eating?: No Do you have difficulty maintaining continence: No Do you need assistance with getting out of bed/getting out of a chair/moving?: No Do you have difficulty managing or taking your medications?: No  Follow up appointments reviewed: PCP Follow-up appointment confirmed?: Yes Date of PCP follow-up appointment?: 09/20/23 Follow-up Provider: [facility] Follow-up appointment confirmed?: NA Do you need transportation to your follow-up appointment?: No Do you understand care options if your condition(s) worsen?: Yes-patient verbalized understanding    SIGNATURE Darrall Ellison, LPN [facility] Nurse Health Advisor Direct Dial [phone]

## 2023-09-20 ENCOUNTER — Encounter: Payer: Self-pay | Admitting: Family Medicine

## 2023-09-20 ENCOUNTER — Ambulatory Visit: Admitting: Family Medicine

## 2023-09-20 VITALS — BP 106/74 | HR 82 | Temp 98.6°F | Ht 72.84 in | Wt 223.8 lb

## 2023-09-20 DIAGNOSIS — K5792 Diverticulitis of intestine, part unspecified, without perforation or abscess without bleeding: Secondary | ICD-10-CM

## 2023-09-20 DIAGNOSIS — Z8719 Personal history of other diseases of the digestive system: Secondary | ICD-10-CM | POA: Diagnosis not present

## 2023-09-20 MED ORDER — AMOXICILLIN-POT CLAVULANATE 875-125 MG PO TABS: 1.0000 | ORAL_TABLET | Freq: Two times a day (BID) | ORAL | 0 refills | Status: AC

## 2023-09-20 NOTE — Patient Instructions (Addendum)
 Let me know if you have trouble getting set up with GI.   Take care.  Glad to see you. Update me as needed.

## 2023-09-20 NOTE — Progress Notes (Signed)
 Inpatient f/u.  Inpatient course discussed with patient.  He was in his usual state of health until he began to have abdominal pain and was admitted through the emergency room at [facility] for diverticulitis.  Imaging discussed with patient, inpatient labs and hospital course discussed.  He had recent colonoscopy, prior to admission.  History of diverticulosis.  IMPRESSION: Findings most consistent with acute sigmoid diverticulitis. There is greater than typically seen inflammatory stranding and fluid and early/impending perforation cannot be excluded. Given the pronounced thickening of the colon at this site (which could be reactive), underlying neoplasm cannot be excluded. Colonoscopy following resolution of acute symptoms is recommended.  Stranding adjacent to the appendix, presumably related to the sigmoid diverticulitis.  Mild calcified atherosclerotic disease, greater than expected for age.   ========================= He needs referral to GI, d/w pt.  He needs to be set up with [facility].  Referral placed.  He is back to normal diet.  Finishing augmentin .  No fevers.  No abd pain.  He is clearly better.  He was able to go back to work in the meantime.  He is already treated with fenofibrate  and atorvastatin  at baseline.  Meds, vitals, and allergies reviewed.   ROS: Per HPI unless specifically indicated in ROS section   GEN: nad, alert and oriented HEENT: NCAT NECK: supple w/o LA CV: rrr.  PULM: ctab, no inc wob ABD: soft, +bs, nontender. EXT: no edema SKIN: Well-perfused

## 2023-09-21 DIAGNOSIS — Z8719 Personal history of other diseases of the digestive system: Secondary | ICD-10-CM | POA: Insufficient documentation

## 2023-09-21 NOTE — Assessment & Plan Note (Signed)
 Clearly better in the meantime.  Okay for outpatient follow-up.  Discussed routine diet considerations when he has acute symptoms.  Otherwise continue with a healthy diet.  Referral placed for follow-up with GI.  Update me as needed.  He agrees with plan.

## 2023-09-22 ENCOUNTER — Other Ambulatory Visit: Payer: Self-pay | Admitting: Internal Medicine

## 2023-10-11 ENCOUNTER — Telehealth: Payer: Self-pay

## 2023-10-11 NOTE — Telephone Encounter (Signed)
 Copied from CRM [id number]. Topic: General - Other >> Oct 11, 2023 12:45 PM Howard Macho wrote: Reason for CRM: joe from [facility] called stating they need clarification on a referral that was faxed to the office on 6/17. Joe stated they have not received a response yet. Joe want to know if they want the patient to have a colonoscopy or an office consult because of the patient having a history of diverticulitis. CB [phone]

## 2023-10-13 NOTE — Telephone Encounter (Signed)
 I think it makes sense to have OV to est care with GI and then see about follow colonoscopy.  Thanks.

## 2023-10-14 NOTE — Telephone Encounter (Signed)
 LVM with Joe from [facility] advising of Dr. Verdene message.

## 2023-10-23 ENCOUNTER — Other Ambulatory Visit: Payer: Self-pay | Admitting: Internal Medicine

## 2023-11-01 ENCOUNTER — Other Ambulatory Visit: Payer: Self-pay | Admitting: Internal Medicine

## 2023-11-27 ENCOUNTER — Ambulatory Visit: Admitting: Internal Medicine

## 2024-01-01 ENCOUNTER — Encounter: Payer: Self-pay | Admitting: Internal Medicine

## 2024-01-01 ENCOUNTER — Ambulatory Visit: Attending: Internal Medicine | Admitting: Internal Medicine

## 2024-01-01 VITALS — BP 120/84 | HR 69 | Ht 72.0 in | Wt 220.5 lb

## 2024-01-01 DIAGNOSIS — I10 Essential (primary) hypertension: Secondary | ICD-10-CM | POA: Diagnosis not present

## 2024-01-01 DIAGNOSIS — E781 Pure hyperglyceridemia: Secondary | ICD-10-CM

## 2024-01-01 DIAGNOSIS — I2510 Atherosclerotic heart disease of native coronary artery without angina pectoris: Secondary | ICD-10-CM

## 2024-01-01 NOTE — Patient Instructions (Signed)
 Medication Instructions:  No changes *If you need a refill on your cardiac medications before your next appointment, please call your pharmacy*  Lab Work: None ordered If you have labs (blood work) drawn today and your tests are completely normal, you will receive your results only by: MyChart Message (if you have MyChart) OR A paper copy in the mail If you have any lab test that is abnormal or we need to change your treatment, we will call you to review the results.  Testing/Procedures: None ordered  Follow-Up: At [facility], you and your health needs are our priority.  As part of our continuing mission to provide you with exceptional heart care, our providers are all part of one team.  This team includes your primary Cardiologist (physician) and Advanced Practice Providers or APPs (Physician Assistants and Nurse Practitioners) who all work together to provide you with the care you need, when you need it.  Your next appointment:   12 month(s)  Provider:   You may see Lonni Hanson, MD or one of the following Advanced Practice Providers on your designated Care Team:   Lonni Meager, NP Lesley Maffucci, PA-C Bernardino Bring, PA-C Cadence [place], PA-C Tylene Lunch, NP Barnie Hila, NP    We recommend signing up for the patient portal called MyChart.  Sign up information is provided on this After Visit Summary.  MyChart is used to connect with patients for Virtual Visits (Telemedicine).  Patients are able to view lab/test results, encounter notes, upcoming appointments, etc.  Non-urgent messages can be sent to your provider as well.   To learn more about what you can do with MyChart, go to [email or web address].

## 2024-01-01 NOTE — Progress Notes (Signed)
  Cardiology Office Note:  .   Date:  01/01/2024  ID:  Roberto Black, DOB May 02, 1975, MRN [id number] PCP: Roberto Arlyss RAMAN, MD  [facility] Providers Cardiologist:  Roberto Hanson, MD     History of Present Illness: .   Roberto Black is a 48 y.o. male with history of coronary artery calcification, hypertriglyceridemia, and family history of coronary artery disease, who presents for follow-up of coronary artery disease.  I last saw him in 08/2022, at which time he was doing well without any cardiac symptoms.  We did not make any medication changes or pursue additional testing.  Today, Roberto Black reports that he has continued to feel well, denying chest pain, shortness of breath, palpitations, lightheadedness, and edema, he remains active, exercising and hiking regularly without any symptoms.  He notes that his older brother and father both recently had coronary events requiring interventions.  ROS: See HPI  Studies Reviewed: SABRA   EKG Interpretation Date/Time:  Wednesday January 01 2024 11:06:05 EDT Ventricular Rate:  69 PR Interval:  176 QRS Duration:  94 QT Interval:  396 QTC Calculation: 424 R Axis:   33  Text Interpretation: Normal sinus rhythm Normal ECG When compared with ECG of 30-Aug-2022 No significant change was found Confirmed by Terri Rorrer, Roberto [phone]) on 01/01/2024 1:14:46 PM    Exercise tolerance test (06/08/2020): Low risk study without evidence of ischemia.  Good exercise capacity with hypertensive blood pressure response.  Coronary calcium  score (05/06/2020): Coronary calcium  score 368 (high), 99th percentile for age and sex matched control.  Incidental note of hepatic steatosis made. Risk Assessment/Calculations:             Physical Exam:   VS:  BP 120/84 (BP Location: Left Arm, Patient Position: Sitting, Cuff Size: Normal)   Pulse 69   Ht 6' (1.829 m)   Wt 220 lb 8 oz (100 kg)   SpO2 99%   BMI 29.91 kg/m    Wt Readings from Last 3 Encounters:   01/01/24 220 lb 8 oz (100 kg)  09/20/23 223 lb 12.8 oz (101.5 kg)  06/07/23 225 lb 6.4 oz (102.2 kg)    General:  NAD. Neck: No JVD or HJR. Lungs: Clear to auscultation bilaterally without wheezes or crackles. Heart: Regular rate and rhythm without murmurs, rubs, or gallops. Abdomen: Soft, nontender, nondistended. Extremities: No lower extremity edema.  ASSESSMENT AND PLAN: .    Coronary artery disease: Roberto Black continues to be asymptomatic.  Lipids have been well-controlled.  I encouraged him to remain active and to alert us  if he develops any symptoms.  We may elect to repeat a coronary calcium  score in about 2 years to assess for progression of his CAD.  Hyperlipidemia: LDL and triglycerides well-controlled on last check in February.  HDL low.  LP(a) previously noted to be normal.  Continue current regimen of atorvastatin  and fenofibrate .  Hypertension: Diastolic blood pressure borderline today.  Continue HCTZ and losartan .  Transaminitis and hepatic steatosis: Mildly elevated ALT again noted on last check in May.  Continue follow-up with Dr. Cleatus.  If ALT were to increase further, may need to consider atorvastatin /fenofibrate  holiday to see if LFTs improve.    Dispo: Return to clinic in 1 year.  Signed, Roberto Hanson, MD

## 2024-01-29 ENCOUNTER — Other Ambulatory Visit: Payer: Self-pay | Admitting: Internal Medicine

## 2024-02-04 ENCOUNTER — Telehealth: Payer: Self-pay | Admitting: Pharmacy Technician

## 2024-02-04 NOTE — Telephone Encounter (Signed)
 .  Pharmacy Patient Advocate Encounter   Received notification from CoverMyMeds that prior authorization for vascepa  is required/requested.   Insurance verification completed.   The patient is insured through [employer] [facility]   Per test claim: PA required; PA submitted to above mentioned insurance via Latent Key/confirmation #/EOC [id number] Status is pending

## 2024-02-04 NOTE — Telephone Encounter (Signed)
 Pharmacy Patient Advocate Encounter  Received notification from [employer] [facility] that Prior Authorization for vascepa  has been APPROVED from 02/04/24 to 02/04/27   PA #/Case ID/Reference #: [id number]

## 2024-05-25 ENCOUNTER — Other Ambulatory Visit: Payer: Self-pay | Admitting: Family Medicine

## 2024-05-25 DIAGNOSIS — I10 Essential (primary) hypertension: Secondary | ICD-10-CM

## 2024-05-26 ENCOUNTER — Other Ambulatory Visit: Payer: Self-pay | Admitting: Family Medicine

## 2024-05-26 DIAGNOSIS — E781 Pure hyperglyceridemia: Secondary | ICD-10-CM

## 2024-06-09 ENCOUNTER — Encounter: Admitting: Family Medicine
